# Patient Record
Sex: Male | Born: 1990
Health system: Southern US, Community
[De-identification: ages and names within clinical notes are randomized; demographics above are authoritative.]

## PROBLEM LIST (undated history)

## (undated) ENCOUNTER — Emergency Department (HOSPITAL_COMMUNITY): Admission: EM | Payer: Self-pay

## (undated) DIAGNOSIS — F329 Major depressive disorder, single episode, unspecified: Secondary | ICD-10-CM

## (undated) DIAGNOSIS — T7840XA Allergy, unspecified, initial encounter: Secondary | ICD-10-CM

## (undated) DIAGNOSIS — F419 Anxiety disorder, unspecified: Secondary | ICD-10-CM

## (undated) DIAGNOSIS — G919 Hydrocephalus, unspecified: Secondary | ICD-10-CM

## (undated) DIAGNOSIS — F32A Depression, unspecified: Secondary | ICD-10-CM

## (undated) DIAGNOSIS — Z982 Presence of cerebrospinal fluid drainage device: Secondary | ICD-10-CM

## (undated) DIAGNOSIS — F988 Other specified behavioral and emotional disorders with onset usually occurring in childhood and adolescence: Secondary | ICD-10-CM

## (undated) HISTORY — DX: Major depressive disorder, single episode, unspecified: F32.9

## (undated) HISTORY — DX: Presence of cerebrospinal fluid drainage device: Z98.2

## (undated) HISTORY — DX: Hydrocephalus, unspecified: G91.9

## (undated) HISTORY — DX: Allergy, unspecified, initial encounter: T78.40XA

## (undated) HISTORY — DX: Other specified behavioral and emotional disorders with onset usually occurring in childhood and adolescence: F98.8

## (undated) HISTORY — DX: Depression, unspecified: F32.A

## (undated) HISTORY — DX: Anxiety disorder, unspecified: F41.9

---
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
## 2008-10-12 ENCOUNTER — Ambulatory Visit (HOSPITAL_COMMUNITY): Payer: Self-pay | Admitting: Psychiatry

## 2010-01-23 ENCOUNTER — Emergency Department (HOSPITAL_COMMUNITY): Admission: EM | Admit: 2010-01-23 | Discharge: 2010-01-23 | Payer: Self-pay | Admitting: Emergency Medicine

## 2010-07-17 LAB — URINALYSIS, ROUTINE W REFLEX MICROSCOPIC
Glucose, UA: NEGATIVE mg/dL
Ketones, ur: >80 mg/dL — AB
Urobilinogen, UA: 0.2 mg/dL (ref 0.0–1.0)
pH: 5.5 (ref 5.0–8.0)

## 2010-07-17 LAB — URINE CULTURE
Colony Count: NO GROWTH
Culture  Setup Time: 201109221934
Culture: NO GROWTH

## 2010-07-17 LAB — POCT PREGNANCY, URINE: Preg Test, Ur: NEGATIVE

## 2010-07-17 LAB — URINE MICROSCOPIC-ADD ON

## 2011-05-05 HISTORY — PX: MASTECTOMY: SHX3

## 2011-10-03 HISTORY — PX: OVARIAN CYST REMOVAL: SHX89

## 2012-06-18 ENCOUNTER — Ambulatory Visit: Payer: BC Managed Care – PPO

## 2012-06-18 ENCOUNTER — Ambulatory Visit (INDEPENDENT_AMBULATORY_CARE_PROVIDER_SITE_OTHER): Payer: BC Managed Care – PPO | Admitting: Internal Medicine

## 2012-06-18 VITALS — BP 128/77 | HR 67 | Temp 99.0°F | Resp 18 | Wt 126.0 lb

## 2012-06-18 DIAGNOSIS — M25579 Pain in unspecified ankle and joints of unspecified foot: Secondary | ICD-10-CM

## 2012-06-18 NOTE — Progress Notes (Signed)
  Subjective:    Patient ID: Aaron Rangel, male    DOB: 30-Jul-1990, 22 y.o.   MRN: [id number]  HPI Pain foot  For 3 weeks as started running class/no abrupt injury/hurts to walk [facility].  Review of Systems Transgender    Objective:   Physical Exam Very tender along the first metatarsal tendon dorsally with mild crepitus Also very tender along the second metatarsal midshaft with deep palpation Pain with plantar flexion of the toe felt dorsally mtp 1/2 intact   [facility] reading (PRIMARY) by  Dr.Dema Timmons=no fx 2nd MT      Assessment & Plan:  Problem #1 tendinitis dorsal flexor tendon #1   mobic 15 #30 written ice15 minutes twice a day Refrain from running/nonweightbearing activities next 2 weeks then advance slowly  follow up 1 month if not well

## 2012-09-01 HISTORY — PX: OTHER SURGICAL HISTORY: SHX169

## 2012-09-30 DIAGNOSIS — F332 Major depressive disorder, recurrent severe without psychotic features: Secondary | ICD-10-CM | POA: Diagnosis present

## 2012-12-14 ENCOUNTER — Emergency Department (HOSPITAL_COMMUNITY)
Admission: EM | Admit: 2012-12-14 | Discharge: 2012-12-15 | Disposition: A | Discharge status: Home / Self Care | Payer: BC Managed Care – PPO | Attending: Emergency Medicine | Admitting: Emergency Medicine

## 2012-12-14 ENCOUNTER — Encounter (HOSPITAL_COMMUNITY): Payer: Self-pay | Admitting: Emergency Medicine

## 2012-12-14 DIAGNOSIS — F411 Generalized anxiety disorder: Secondary | ICD-10-CM | POA: Insufficient documentation

## 2012-12-14 DIAGNOSIS — R109 Unspecified abdominal pain: Secondary | ICD-10-CM | POA: Insufficient documentation

## 2012-12-14 DIAGNOSIS — Z79899 Other long term (current) drug therapy: Secondary | ICD-10-CM | POA: Insufficient documentation

## 2012-12-14 DIAGNOSIS — F329 Major depressive disorder, single episode, unspecified: Secondary | ICD-10-CM | POA: Insufficient documentation

## 2012-12-14 DIAGNOSIS — F3289 Other specified depressive episodes: Secondary | ICD-10-CM | POA: Insufficient documentation

## 2012-12-14 DIAGNOSIS — R110 Nausea: Secondary | ICD-10-CM | POA: Insufficient documentation

## 2012-12-14 DIAGNOSIS — Z87891 Personal history of nicotine dependence: Secondary | ICD-10-CM | POA: Insufficient documentation

## 2012-12-14 DIAGNOSIS — Z9889 Other specified postprocedural states: Secondary | ICD-10-CM | POA: Insufficient documentation

## 2012-12-14 LAB — COMPREHENSIVE METABOLIC PANEL
Albumin: 4.2 g/dL (ref 3.5–5.2)
BUN: 16 mg/dL (ref 6–23)
Chloride: 102 mEq/L (ref 96–112)
GFR calc non Af Amer: >90 mL/min (ref >90)
Glucose, Bld: 106 mg/dL — ABNORMAL HIGH (ref 70–99)
Sodium: 136 mEq/L (ref 135–145)

## 2012-12-14 LAB — CBC WITH DIFFERENTIAL/PLATELET
Basophils Absolute: 0 10*3/uL (ref 0.0–0.1)
Basophils Relative: 0 % (ref 0–1)
Eosinophils Absolute: 0.1 10*3/uL (ref 0.0–0.7)
Eosinophils Relative: 1 % (ref 0–5)
HCT: 40.7 % (ref 39.0–52.0)
Lymphs Abs: 4 10*3/uL (ref 0.7–4.0)
MCH: 26.6 pg (ref 26.0–34.0)
MCHC: 32.7 g/dL (ref 30.0–36.0)
MCV: 81.4 fL (ref 78.0–100.0)
RBC: 5 MIL/uL (ref 4.22–5.81)
WBC: 9.8 10*3/uL (ref 4.0–10.5)

## 2012-12-14 LAB — LIPASE, BLOOD: Lipase: 31 U/L (ref 11–59)

## 2012-12-14 MED ORDER — HYDROMORPHONE HCL PF 1 MG/ML IJ SOLN
1.0000 mg | Freq: Once | INTRAMUSCULAR | Status: AC
  Administered 2012-12-15: 1 mg via INTRAVENOUS
  Filled 2012-12-14: qty 1

## 2012-12-14 MED ORDER — DIPHENHYDRAMINE HCL 50 MG/ML IJ SOLN
25.0000 mg | Freq: Once | INTRAMUSCULAR | Status: AC
  Administered 2012-12-15: 25 mg via INTRAVENOUS
  Filled 2012-12-14: qty 1

## 2012-12-14 MED ORDER — ONDANSETRON HCL 4 MG/2ML IJ SOLN
4.0000 mg | Freq: Once | INTRAMUSCULAR | Status: AC
  Administered 2012-12-15: 4 mg via INTRAVENOUS
  Filled 2012-12-14: qty 2

## 2012-12-14 NOTE — ED Provider Notes (Signed)
CSN: [id number]     Arrival date & time 12/14/12  2254 History     First MD Initiated Contact with Patient 12/14/12 2324     Chief Complaint  Patient presents with  . Abdominal Pain   (Consider location/radiation/quality/duration/timing/severity/associated sxs/prior Treatment) HPI Comments: Patient with history of upper gender reassignment surgery (male sex, male gender), h/o R ovarian cyst s/p laproscopy, no other abdominal surgery -- presents with RLQ abd pain similar to previous ovarian cyst pain x 2 days. Radiation into back and leg. No fever. Nausea, no vomiting. No diarrhea, blood in stool. LMP 1 year ago. No urinary sx.  The onset of this condition was acute. The course is constant. Aggravating factors: none. Alleviating factors: none.    The history is provided by the patient.    Past Medical History  Diagnosis Date  . Depression   . Anxiety    Past Surgical History  Procedure Laterality Date  . Brain surgery    . Cardiac surgery    . Ovarian cyst removal    . Mastectomy      double  . Breast surgery     Family History  Problem Relation Age of Onset  . Cancer Maternal Grandmother   . Cancer Maternal Grandfather    History  Substance Use Topics  . Smoking status: Former Games developer  . Smokeless tobacco: Not on file  . Alcohol Use: No     Comment: weekly    Review of Systems  Constitutional: Negative for fever.  HENT: Negative for sore throat and rhinorrhea.   Eyes: Negative for redness.  Respiratory: Negative for cough.   Cardiovascular: Negative for chest pain.  Gastrointestinal: Positive for nausea. Negative for vomiting, abdominal pain and diarrhea.  Genitourinary: Negative for dysuria and hematuria.       + R pelvic pain  Musculoskeletal: Negative for myalgias.  Skin: Negative for rash.  Neurological: Negative for headaches.    Allergies  Morphine and related and Sulfa antibiotics  Home Medications   Current Outpatient Rx  Name  Route  Sig   Dispense  Refill  . citalopram (CELEXA) 20 MG tablet   Oral   Take 20 mg by mouth at bedtime.         . methylphenidate (CONCERTA) 36 MG CR tablet   Oral   Take 72 mg by mouth daily.         Marland Kitchen testosterone cypionate (DEPOTESTOTERONE CYPIONATE) 100 MG/ML injection   Intramuscular   Inject into the muscle every 14 (fourteen) days. For IM use only          BP 135/83  Pulse 73  Temp(Src) 98.2 F (36.8 C) (Oral)  Resp 18  Ht 5\' 6"  (1.676 m)  Wt 124 lb (56.246 kg)  BMI 20.02 kg/m2  SpO2 99%  Physical Exam  Nursing note and vitals reviewed. Constitutional: He appears well-developed and well-nourished.  HENT:  Head: Normocephalic and atraumatic.  Eyes: Conjunctivae are normal. Right eye exhibits no discharge. Left eye exhibits no discharge.  Neck: Normal range of motion. Neck supple.  Cardiovascular: Normal rate, regular rhythm and normal heart sounds.   Pulmonary/Chest: Effort normal and breath sounds normal.  Abdominal: Soft. There is tenderness in the right lower quadrant, suprapubic area and left lower quadrant. There is no rebound and no guarding.  Genitourinary:  Pelvic exam: normal external male genitalia, no vaginal bleeding or discharge, no CMT, R adnexal tenderness no masses  Neurological: He is alert.  Skin: Skin is warm and  dry.  Psychiatric: He has a normal mood and affect.    ED Course   Procedures (including critical care time)  Labs Reviewed  WET PREP, GENITAL - Abnormal; Notable for the following:    WBC, Wet Prep HPF POC RARE (*)    All other components within normal limits  COMPREHENSIVE METABOLIC PANEL - Abnormal; Notable for the following:    Glucose, Bld 106 (*)    All other components within normal limits  URINALYSIS, ROUTINE W REFLEX MICROSCOPIC - Abnormal; Notable for the following:    Leukocytes, UA SMALL (*)    All other components within normal limits  URINE MICROSCOPIC-ADD ON - Abnormal; Notable for the following:    Bacteria, UA  FEW (*)    All other components within normal limits  GC/CHLAMYDIA PROBE AMP  CBC WITH DIFFERENTIAL  LIPASE, BLOOD   US Pelvis Complete  12/15/2012   *RADIOLOGY REPORT*  Clinical Data:  Right-sided pelvic pain.  Ongoing gender re- assignment treatment. The patient declined transvaginal imaging.  TRANSABDOMINAL ULTRASOUND OF PELVIS  DOPPLER ULTRASOUND OF OVARIES  Technique:  Transabdominal ultrasound examination of the pelvis was performed including evaluation of the uterus, ovaries, adnexal regions, and pelvic cul-de-sac.  Color and duplex Doppler ultrasound was utilized to evaluate blood flow to the ovaries.  Comparison:  None  Findings:  Uterus: 5.8 x 3.2 x 2.6 cm.  Normal transabdominal appearance.  Endometrium: Not well visualized transabdominally.  Right ovary: 3.6 x 2.5 x 1.7 cm.  Normal.  Left ovary: 2.3 x 2.1 x 1.8 cm.  Normal.  Other Findings:  No free fluid  Pulsed Doppler evaluation demonstrates normal low-resistance arterial and venous waveforms in both ovaries.  IMPRESSION: Normal exam.  No evidence of pelvis mass or other significant abnormality.  No sonographic evidence for ovarian torsion   Original Report Authenticated By: Christiana Pellant, M.D.   Ct Abdomen Pelvis W Contrast  12/15/2012   *RADIOLOGY REPORT*  Clinical Data: Right lower quadrant pain.  Fever.  CT ABDOMEN AND PELVIS WITH CONTRAST  Technique:  Multidetector CT imaging of the abdomen and pelvis was performed following the standard protocol during bolus administration of intravenous contrast.  Contrast: OMNIPAQUE IOHEXOL 300 MG/ML  SOLN  Comparison: CT of the abdomen and pelvis 01/23/2010.  Findings:  Lung Bases: Unremarkable.  Abdomen/Pelvis:  Low attenuation adjacent to the falciform ligament within segment four of the liver is compatible with a benign perfusion anomaly or focal fat.  No other focal hepatic lesions are noted.  The appearance of the gallbladder, pancreas, spleen, bilateral adrenal glands and bilateral  kidneys is unremarkable.  No significant volume of ascites.  No pneumoperitoneum.  No pathologic distension of small bowel.  Normal appendix.  No definite pathologic lymphadenopathy identified within the abdomen or pelvis. Uterus and ovaries are unremarkable in appearance. Ventriculoperitoneal shunt tubing terminates in the left side of the pelvis.  Musculoskeletal: There are no aggressive appearing lytic or blastic lesions noted in the visualized portions of the skeleton.  IMPRESSION: 1.  No acute findings in the abdomen pelvis to account the patient's symptoms. 2.  Specifically, the appendix is normal. 3.  Additional incidental findings, as above.   Original Report Authenticated By: Trudie Reed, M.D.   [place] Art/ven Flow Abd Pelv Doppler  12/15/2012   *RADIOLOGY REPORT*  Clinical Data:  Right-sided pelvic pain.  Ongoing gender re- assignment treatment. The patient declined transvaginal imaging.  TRANSABDOMINAL ULTRASOUND OF PELVIS  DOPPLER ULTRASOUND OF OVARIES  Technique:  Transabdominal ultrasound examination  of the pelvis was performed including evaluation of the uterus, ovaries, adnexal regions, and pelvic cul-de-sac.  Color and duplex Doppler ultrasound was utilized to evaluate blood flow to the ovaries.  Comparison:  None  Findings:  Uterus: 5.8 x 3.2 x 2.6 cm.  Normal transabdominal appearance.  Endometrium: Not well visualized transabdominally.  Right ovary: 3.6 x 2.5 x 1.7 cm.  Normal.  Left ovary: 2.3 x 2.1 x 1.8 cm.  Normal.  Other Findings:  No free fluid  Pulsed Doppler evaluation demonstrates normal low-resistance arterial and venous waveforms in both ovaries.  IMPRESSION: Normal exam.  No evidence of pelvis mass or other significant abnormality.  No sonographic evidence for ovarian torsion   Original Report Authenticated By: Christiana Pellant, M.D.   1. Abdominal pain     12:01 AM Patient seen and examined. Work-up initiated. Medications ordered.   Vital signs reviewed and are as  follows: Filed Vitals:   12/14/12 2305  BP: 135/83  Pulse: 73  Temp: 98.2 F (36.8 C)  Resp: 18   12:40 AM Pelvic performed by Dorthula Nettles PA-S under my direct supervision, nurse chaperone present. Pt offered pelvic US and accepts.   Handoff to Dr. Dierdre Highman who will see. Will likely order CT.     MDM  RLQ abd pain. [place] neg for cyst.   Renne Crigler, PA-C 12/15/12 1829

## 2012-12-14 NOTE — ED Notes (Signed)
Pt c/o RLQ pain onset 2 days ago, intermittent fever. Worse with movement, walking.

## 2012-12-15 ENCOUNTER — Emergency Department (HOSPITAL_COMMUNITY): Payer: BC Managed Care – PPO

## 2012-12-15 ENCOUNTER — Encounter (HOSPITAL_COMMUNITY): Payer: Self-pay | Admitting: Radiology

## 2012-12-15 LAB — URINALYSIS, ROUTINE W REFLEX MICROSCOPIC
Glucose, UA: NEGATIVE mg/dL
Protein, ur: NEGATIVE mg/dL
Urobilinogen, UA: 0.2 mg/dL (ref 0.0–1.0)
pH: 6.5 (ref 5.0–8.0)

## 2012-12-15 LAB — GC/CHLAMYDIA PROBE AMP
CT Probe RNA: NEGATIVE
GC Probe RNA: NEGATIVE

## 2012-12-15 LAB — WET PREP, GENITAL: Yeast Wet Prep HPF POC: NONE SEEN

## 2012-12-15 LAB — URINE MICROSCOPIC-ADD ON

## 2012-12-15 MED ORDER — IOHEXOL 300 MG/ML  SOLN
100.0000 mL | Freq: Once | INTRAMUSCULAR | Status: AC | PRN
  Administered 2012-12-15: 100 mL via INTRAVENOUS

## 2012-12-15 MED ORDER — TRAMADOL HCL 50 MG PO TABS: 50.0000 mg | ORAL_TABLET | Freq: Four times a day (QID) | ORAL | Status: DC | PRN

## 2012-12-15 MED ORDER — HYDROMORPHONE HCL PF 1 MG/ML IJ SOLN
1.0000 mg | Freq: Once | INTRAMUSCULAR | Status: AC
  Administered 2012-12-15: 1 mg via INTRAVENOUS
  Filled 2012-12-15: qty 1

## 2012-12-15 MED ORDER — IOHEXOL 300 MG/ML  SOLN
50.0000 mL | Freq: Once | INTRAMUSCULAR | Status: AC | PRN
  Administered 2012-12-15: 50 mL via ORAL

## 2012-12-15 MED ORDER — DIPHENHYDRAMINE HCL 50 MG/ML IJ SOLN
25.0000 mg | Freq: Once | INTRAMUSCULAR | Status: AC
  Administered 2012-12-15: 25 mg via INTRAVENOUS

## 2012-12-15 NOTE — ED Notes (Signed)
Patient is alert and oriented x3.  He was given DC instructions and follow up visit instructions.  Patient gave verbal understanding.  He was DC ambulatory under his own power to home.  V/S stable.  He was not showing any signs of distress on DC 

## 2012-12-15 NOTE — ED Provider Notes (Signed)
Medical screening examination/treatment/procedure(s) were conducted as a shared visit with non-physician practitioner(s) and myself.  I personally evaluated the patient during the encounter  RLQ pain and TTP, CT scan without abnormalities, pain free on recheck. Rx and referral provided with d/c and f/u instructions  Sunnie Nielsen, MD 12/15/12 1907

## 2013-06-02 ENCOUNTER — Ambulatory Visit (INDEPENDENT_AMBULATORY_CARE_PROVIDER_SITE_OTHER): Payer: BC Managed Care – PPO | Admitting: Physician Assistant

## 2013-06-02 VITALS — BP 120/76 | HR 90 | Temp 98.3°F | Resp 18 | Ht 65.5 in | Wt 125.0 lb

## 2013-06-02 DIAGNOSIS — R05 Cough: Secondary | ICD-10-CM

## 2013-06-02 DIAGNOSIS — J020 Streptococcal pharyngitis: Secondary | ICD-10-CM

## 2013-06-02 DIAGNOSIS — J029 Acute pharyngitis, unspecified: Secondary | ICD-10-CM

## 2013-06-02 DIAGNOSIS — R059 Cough, unspecified: Secondary | ICD-10-CM

## 2013-06-02 LAB — POCT INFLUENZA A/B
INFLUENZA A, POC: NEGATIVE
Influenza B, POC: NEGATIVE

## 2013-06-02 LAB — POCT CBC
Granulocyte percent: 70.6 %G (ref 37–80)
HCT, POC: 48.8 % (ref 43.5–53.7)
Hemoglobin: 15.3 g/dL (ref 14.1–18.1)
Lymph, poc: 2.2 (ref 0.6–3.4)
MCH: 29.1 pg (ref 27–31.2)
MCHC: 31.4 g/dL — AB (ref 31.8–35.4)
MCV: 92.7 fL (ref 80–97)
MID (CBC): 0.7 (ref 0–0.9)
MPV: 8.5 fL (ref 0–99.8)
PLATELET COUNT, POC: 223 10*3/uL (ref 142–424)
POC GRANULOCYTE: 6.8 (ref 2–6.9)
POC LYMPH PERCENT: 22.6 %L (ref 10–50)
POC MID %: 6.8 % (ref 0–12)
RBC: 5.26 M/uL (ref 4.69–6.13)
RDW, POC: 13.7 %
WBC: 9.7 10*3/uL (ref 4.6–10.2)

## 2013-06-02 LAB — POCT RAPID STREP A (OFFICE): RAPID STREP A SCREEN: NEGATIVE

## 2013-06-02 MED ORDER — HYDROCODONE-HOMATROPINE 5-1.5 MG/5ML PO SYRP: ORAL_SOLUTION | ORAL | Status: DC

## 2013-06-02 MED ORDER — AMOXICILLIN-POT CLAVULANATE 875-125 MG PO TABS: 1.0000 | ORAL_TABLET | Freq: Two times a day (BID) | ORAL | Status: DC

## 2013-06-02 MED ORDER — IPRATROPIUM BROMIDE 0.06 % NA SOLN: 2.0000 | Freq: Three times a day (TID) | NASAL | Status: DC

## 2013-06-02 NOTE — Progress Notes (Signed)
Subjective:    Patient ID: Aaron Rangel, male    DOB: October 13, 1990, 23 y.o.   MRN: [id number]  HPI Primary Physician: No primary provider on file.  Chief Complaint: URI x 6 days  HPI: 23 y.o. male with history below presents with 6 day history of nasal congestion, sore throat, cough, post nasal drip, rhinorrhea, sinus pressure, sneezing, fever, chills, myalgias, and fatigue. Subjective fever and chills, but when he checks his temperature he is afebrile. Cough has been occasionally productive of yellow sputum. Some SOB without wheezing. Nasal congestion waxes and wanes. Headache is located along the frontal region. His fiance is sick with similar illness. She feels a little better than him currently and went to work today.  He did get an influenza vaccine this year. Has tried ibuprofen, Sudafed, Nyquil, Dayquil, and Excedrin for the migraines.   Past Medical History  Diagnosis Date  . Depression   . Anxiety      Home Meds: Prior to Admission medications   Medication Sig Start Date End Date Taking? Authorizing Provider  buPROPion (WELLBUTRIN XL) 300 MG 24 hr tablet Take 300 mg by mouth daily.   Yes Historical Provider, MD  testosterone cypionate (DEPOTESTOTERONE CYPIONATE) 100 MG/ML injection Inject into the muscle every 14 (fourteen) days. For IM use only   Yes Historical Provider, MD  traMADol (ULTRAM) 50 MG tablet Take 1 tablet (50 mg total) by mouth every 6 (six) hours as needed for pain. 12/15/12  Yes Sunnie Nielsen, MD    Allergies:  Allergies  Allergen Reactions  . Morphine And Related   . Sulfa Antibiotics     History   Social History  . Marital Status: Single    Spouse Name: N/A    Number of Children: N/A  . Years of Education: N/A   Occupational History  . Not on file.   Social History Main Topics  . Smoking status: Former Games developer  . Smokeless tobacco: Not on file  . Alcohol Use: No     Comment: weekly  . Drug Use: No  . Sexual Activity: Yes   Other Topics  Concern  . Not on file   Social History Narrative  . No narrative on file      Review of Systems  Constitutional: Positive for fever, chills, appetite change and fatigue.       Afebrile, but hot and cold.   HENT: Positive for congestion, ear pain, hearing loss, postnasal drip, rhinorrhea, sinus pressure, sneezing and sore throat.   Respiratory: Positive for cough, chest tightness and shortness of breath. Negative for wheezing.        Cough is sometimes productive of yellow sputum.  Cough is worse in the morning or if he talks a lot during the day then it is bad during the day.   Gastrointestinal: Positive for nausea and diarrhea. Negative for vomiting.       Diarrhea x 1 day.   Neurological: Positive for headaches.       Headache is located along the frontal region.        Objective:   Physical Exam  Physical Exam: Blood pressure 120/76, pulse 90, temperature 98.3 F (36.8 C), temperature source Oral, resp. rate 18, height 5' 5.5" (1.664 m), weight 125 lb (56.7 kg), SpO2 97.00%., Body mass index is 20.48 kg/(m^2). General: Well developed, well nourished, in no acute distress. Head: Normocephalic, atraumatic, eyes without discharge, sclera non-icteric, nares are congested. Bilateral auditory canals clear, TM's are without perforation, pearly grey  with reflective cone of light bilaterally. No sinus TTP. Oral cavity moist, dentition normal. Posterior pharynx erythematous with exudate along the hand side.No post nasal drip or peritonsillar abscess. Uvula midline.  Neck: Supple. No thyromegaly. Full ROM. Lymph nodes: less than 2 cm AC bilaterally. Lungs: Clear to auscultation bilaterally without wheezes, rales, or rhonchi. Breathing is unlabored.  Heart: RRR with S1 S2. No murmurs, rubs, or gallops appreciated. Msk:  Strength and tone normal for age. Extremities: No clubbing or cyanosis. No edema. Neuro: Alert and oriented X 3. Moves all extremities spontaneously. CNII-XII grossly in  tact. Psych:  Responds to questions appropriately with a normal affect.   Labs: Results for orders placed in visit on 06/02/13  POCT CBC      Result Value Range   WBC 9.7  4.6 - 10.2 K/uL   Lymph, poc 2.2  0.6 - 3.4   POC LYMPH PERCENT 22.6  10 - 50 %L   MID (cbc) 0.7  0 - 0.9   POC MID % 6.8  0 - 12 %M   POC Granulocyte 6.8  2 - 6.9   Granulocyte percent 70.6  37 - 80 %G   RBC 5.26  4.69 - 6.13 M/uL   Hemoglobin 15.3  14.1 - 18.1 g/dL   HCT, POC 16.148.8  09.643.5 - 53.7 %   MCV 92.7  80 - 97 fL   MCH, POC 29.1  27 - 31.2 pg   MCHC 31.4 (*) 31.8 - 35.4 g/dL   RDW, POC 04.513.7     Platelet Count, POC 223  142 - 424 K/uL   MPV 8.5  0 - 99.8 fL  POCT INFLUENZA A/B      Result Value Range   Influenza A, POC Negative     Influenza B, POC Negative    POCT RAPID STREP A (OFFICE)      Result Value Range   Rapid Strep A Screen Negative  Negative    Throat culture pending. Lab instructed me of a possible faint line on the RST.      Assessment & Plan:  23 year old male with likely strep pharyngitis and cough  -Augmentin 875/125 mg 1 po bid #20 no RF -Hycodan #4oz 1 tsp po q 4-6 hours prn cough no RF SED, reviewed with patient that he can tolerate this -Atrovent NS 0.06% 2 sprays each nare bid prn #1 no RF -New tooth brush -Rest/fluids -RTC precautions   Eula Listenyan Arlyn Buerkle, MHS, PA-C [facility] [address] [place], [place] [address] [phone] [facility] 06/02/2013 10:40 AM

## 2013-06-04 LAB — CULTURE, GROUP A STREP: Organism ID, Bacteria: NORMAL

## 2013-10-28 ENCOUNTER — Ambulatory Visit (INDEPENDENT_AMBULATORY_CARE_PROVIDER_SITE_OTHER): Payer: BC Managed Care – PPO | Admitting: Family Medicine

## 2013-10-28 VITALS — BP 120/90 | HR 80 | Temp 98.3°F | Resp 16 | Ht 65.0 in | Wt 127.0 lb

## 2013-10-28 DIAGNOSIS — J029 Acute pharyngitis, unspecified: Secondary | ICD-10-CM

## 2013-10-28 DIAGNOSIS — J028 Acute pharyngitis due to other specified organisms: Secondary | ICD-10-CM

## 2013-10-28 LAB — POCT RAPID STREP A (OFFICE): Rapid Strep A Screen: NEGATIVE

## 2013-10-28 MED ORDER — AZITHROMYCIN 250 MG PO TABS: ORAL_TABLET | ORAL | Status: DC

## 2013-10-28 MED ORDER — AMOXICILLIN 875 MG PO TABS: 875.0000 mg | ORAL_TABLET | Freq: Two times a day (BID) | ORAL | Status: DC

## 2013-10-28 NOTE — Progress Notes (Addendum)
The chart was scribed for Aaron SidleKurt Lauenstein, MD, by Aaron Rangel, ED Scribe. This patient's care was started at 12:18 PM.  Patient ID: Aaron Rangel MRN: [id number], DOB: 1991/02/06, 22 y.o. Date of Encounter: 10/28/2013, 12:17 PM  Primary Physician: No PCP Per Patient  Chief Complaint:  Chief Complaint  Patient presents with   Otalgia   Headache     HPI: 23 y.o. year old male with history below presents with right-sided otalgia, onset of three days ago, which has also been associated with a sore throat, worse on the right. He states he has also had a headache for a week. Mr. Aaron Rangel denies a fever. He has been swimming recently.   The pt works at a rehabilitation center.    Past Medical History  Diagnosis Date   Depression    Anxiety      Home Meds: Prior to Admission medications   Medication Sig Start Date End Date Taking? Authorizing Provider  buPROPion (WELLBUTRIN XL) 300 MG 24 hr tablet Take 450 mg by mouth daily.    Yes Historical Provider, MD  testosterone cypionate (DEPOTESTOTERONE CYPIONATE) 100 MG/ML injection Inject into the muscle every 14 (fourteen) days. For IM use only   Yes Historical Provider, MD    Allergies:  Allergies  Allergen Reactions   Morphine And Related    Sulfa Antibiotics     History   Social History   Marital Status: Single    Spouse Name: N/A    Number of Children: N/A   Years of Education: N/A   Occupational History   Not on file.   Social History Main Topics   Smoking status: Former Smoker   Smokeless tobacco: Not on file   Alcohol Use: No     Comment: weekly   Drug Use: No   Sexual Activity: Yes   Other Topics Concern   Not on file   Social History Narrative   No narrative on file     Review of Systems: Constitutional: negative for chills, fever, night sweats, weight changes, or fatigue  HEENT: positive for otalgia and sore throat; negative for vision changes, hearing loss, congestion, rhinorrhea,  epistaxis, or sinus pressure Cardiovascular: negative for chest pain or palpitations Respiratory: negative for hemoptysis, wheezing, shortness of breath, or cough Abdominal: negative for abdominal pain, nausea, vomiting, diarrhea, or constipation Dermatological: negative for rash Neurologic: negative for headache, dizziness, or syncope All other systems reviewed and are otherwise negative with the exception to those above and in the HPI.   Physical Exam: Triage Vitals: Blood pressure 120/90, pulse 80, temperature 98.3 F (36.8 C), temperature source Oral, resp. rate 16, height 5\' 5"  (1.651 m), weight 127 lb (57.607 kg), SpO2 98.00%., Body mass index is 21.13 kg/(m^2). General: Well developed, well nourished, in no acute distress. Head: Normocephalic, atraumatic, eyes without discharge, sclera non-icteric, nares are without discharge. Bilateral auditory canals clear, TM's are without perforation, pearly grey and translucent with reflective cone of light bilaterally. Oral cavity moist, posterior pharynx without exudate, erythema, peritonsillar abscess, or post nasal drip.  Neck: Supple. No thyromegaly. Full ROM. No lymphadenopathy. Lungs: Clear bilaterally to auscultation without wheezes, rales, or rhonchi. Breathing is unlabored. Heart: RRR with S1 S2. No murmurs, rubs, or gallops appreciated. Abdomen: Soft, non-tender, non-distended with normoactive bowel sounds. No hepatomegaly. No rebound/guarding. No obvious abdominal masses. Msk:  Strength and tone normal for age. Extremities/Skin: Warm and dry. No clubbing or cyanosis. No edema. No rashes or suspicious lesions. Neuro: Alert and oriented  X 3. Moves all extremities spontaneously. Gait is normal. CNII-XII grossly in tact. Psych:  Responds to questions appropriately with a normal affect.  Tender AC nodes Mild erythema R>L throat Labs: Results for orders placed in visit on 06/02/13  CULTURE, GROUP A STREP      Result Value Ref Range    Organism ID, Bacteria Normal Upper Respiratory Flora     Organism ID, Bacteria No Beta Hemolytic Streptococci Isolated    POCT CBC      Result Value Ref Range   WBC 9.7  4.6 - 10.2 K/uL   Lymph, poc 2.2  0.6 - 3.4   POC LYMPH PERCENT 22.6  10 - 50 %L   MID (cbc) 0.7  0 - 0.9   POC MID % 6.8  0 - 12 %M   POC Granulocyte 6.8  2 - 6.9   Granulocyte percent 70.6  37 - 80 %G   RBC 5.26  4.69 - 6.13 M/uL   Hemoglobin 15.3  14.1 - 18.1 g/dL   HCT, POC 95.648.8  21.343.5 - 53.7 %   MCV 92.7  80 - 97 fL   MCH, POC 29.1  27 - 31.2 pg   MCHC 31.4 (*) 31.8 - 35.4 g/dL   RDW, POC 08.613.7     Platelet Count, POC 223  142 - 424 K/uL   MPV 8.5  0 - 99.8 fL  POCT INFLUENZA A/B      Result Value Ref Range   Influenza A, POC Negative     Influenza B, POC Negative    POCT RAPID STREP A (OFFICE)      Result Value Ref Range   Rapid Strep A Screen Negative  Negative    Results for orders placed in visit on 10/28/13  POCT RAPID STREP A (OFFICE)      Result Value Ref Range   Rapid Strep A Screen Negative  Negative     ASSESSMENT AND PLAN:  23 y.o. year old male with Acute pharyngitis due to other specified organisms - Plan: POCT rapid strep A, Throat culture ([facility]), amoxicillin (AMOXIL) 875 MG tablet     Signed, Aaron SidleKurt Lauenstein, MD 10/28/2013 12:17 PM

## 2013-10-28 NOTE — Addendum Note (Signed)
Addended by: Cydney OkAUGUSTIN, TAMARA N on: 10/28/2013 01:52 PM   Modules accepted: Orders, Medications

## 2013-10-28 NOTE — Patient Instructions (Signed)

## 2013-10-30 LAB — CULTURE, GROUP A STREP: Organism ID, Bacteria: NORMAL

## 2013-11-09 HISTORY — PX: TOTAL LAPAROSCOPIC HYSTERECTOMY WITH SALPINGECTOMY: SHX6742

## 2014-01-21 IMAGING — CT CT ABD-PELV W/ CM
1 of 2 series · 15 of 32 positions shown, 19 images · IV contrast (omnipaque)
Comparison: CT of the abdomen and pelvis 01/23/2010.

CLINICAL DATA: Right lower quadrant pain.  Fever.

CT ABDOMEN AND PELVIS WITH CONTRAST
TECHNIQUE: Multidetector CT imaging of the abdomen and pelvis was
performed following the standard protocol during bolus
administration of intravenous contrast.
Contrast: 100mL OMNIPAQUE IOHEXOL 300 MG/ML  SOLN

[Series 2: abd/pel with · axial · 0.64mm/px · z∈[+1248,+1598]mm · 15 of 79 slices shown, 19 images]
[im 6/79  soft-tissue]
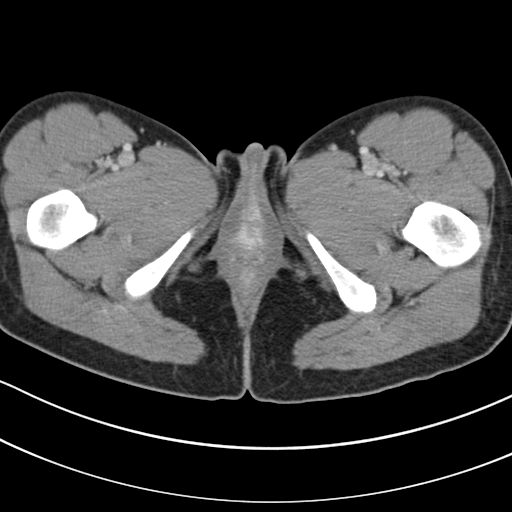
[im 6/79  bone]
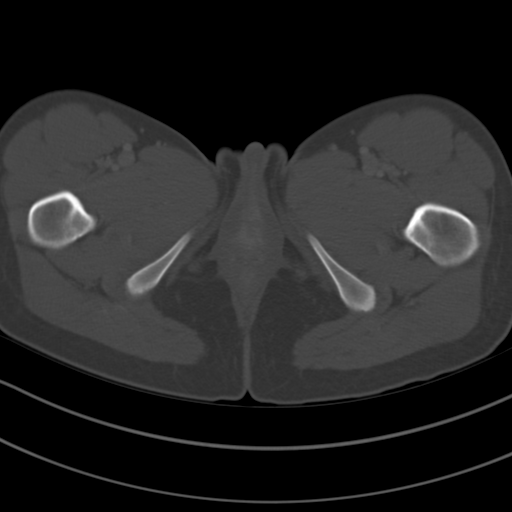
[im 12/79  soft-tissue]
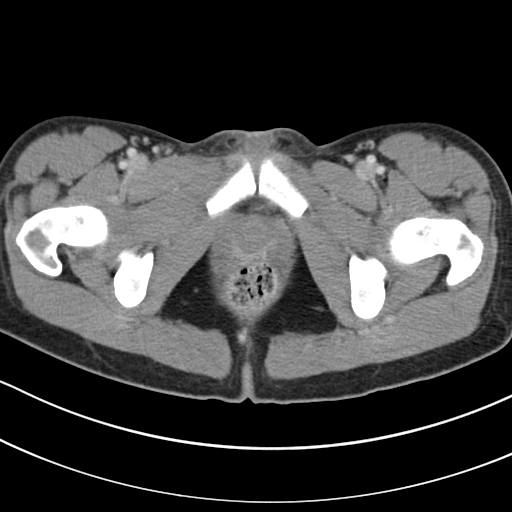
[im 18/79  soft-tissue]
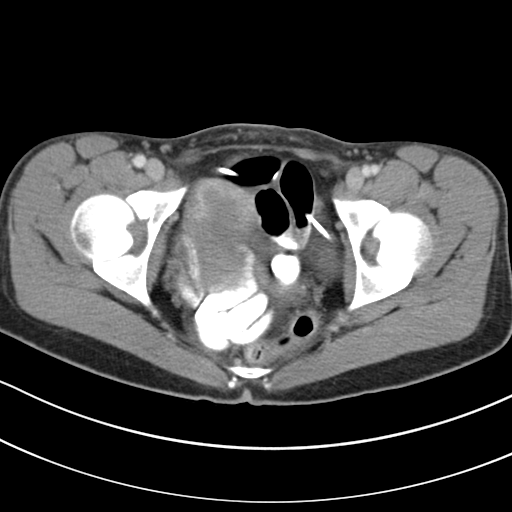
[im 24/79  soft-tissue]
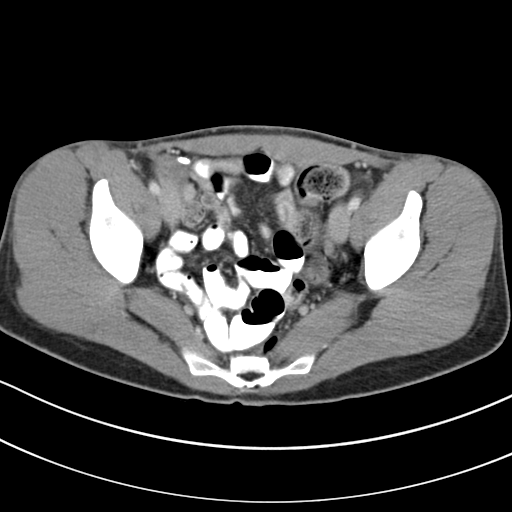
[im 29/79  soft-tissue]
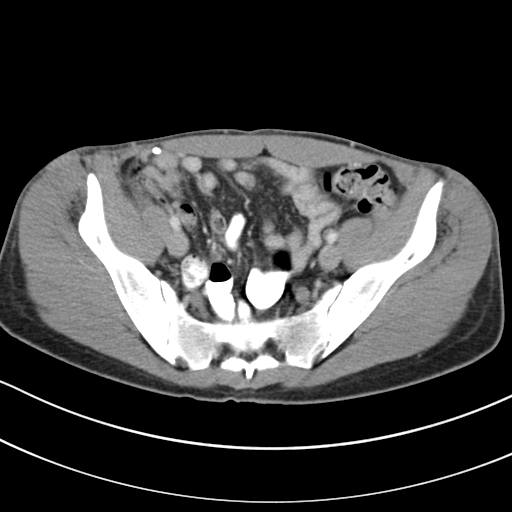
[im 35/79  soft-tissue]
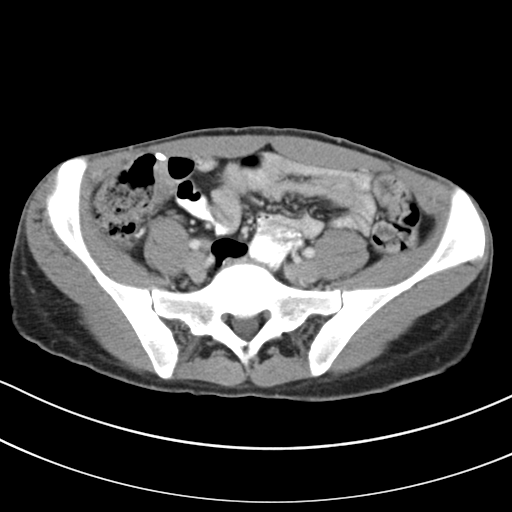
[im 41/79  soft-tissue]
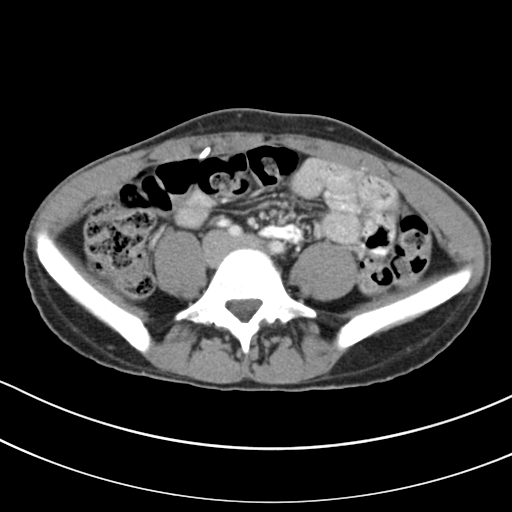
[im 47/79  soft-tissue]
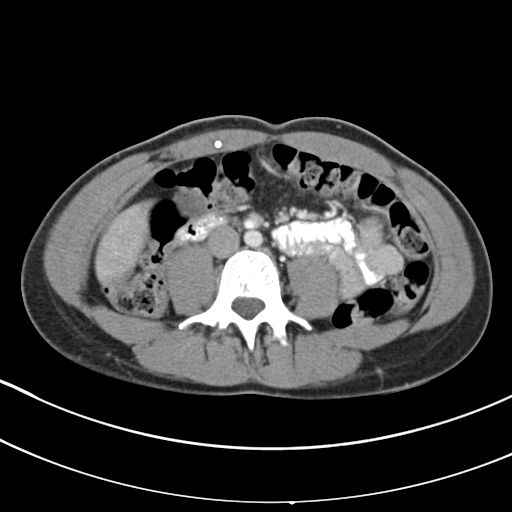
[im 53/79  soft-tissue]
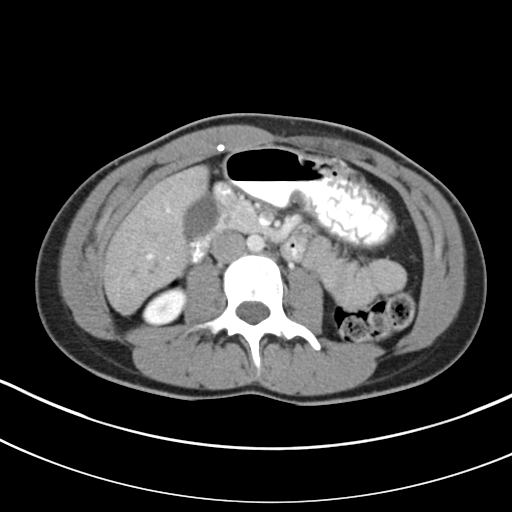
[im 53/79  bone]
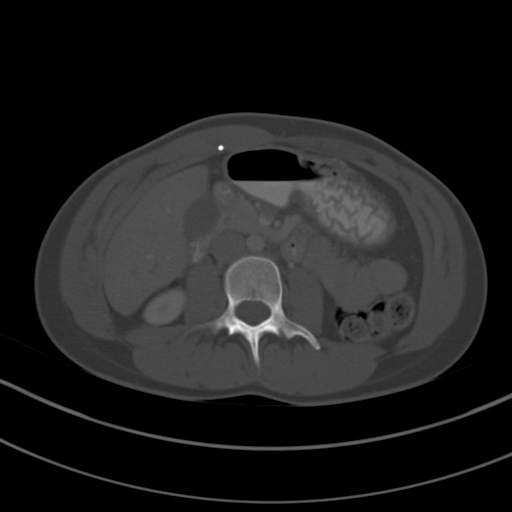
[im 58/79  soft-tissue]
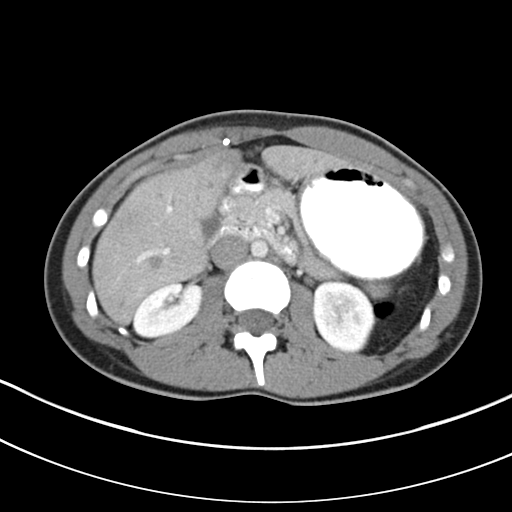
[im 64/79  soft-tissue]
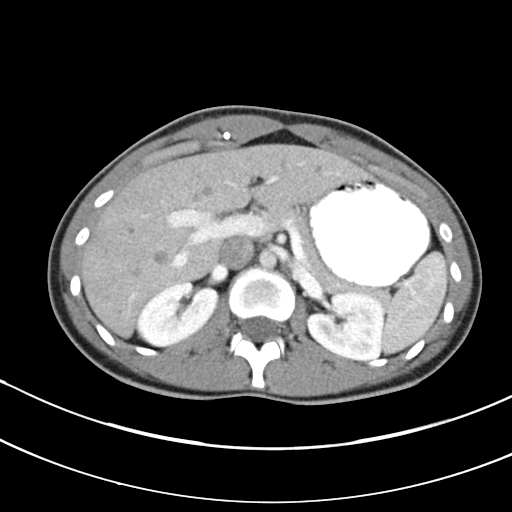
[im 67/79  lung]
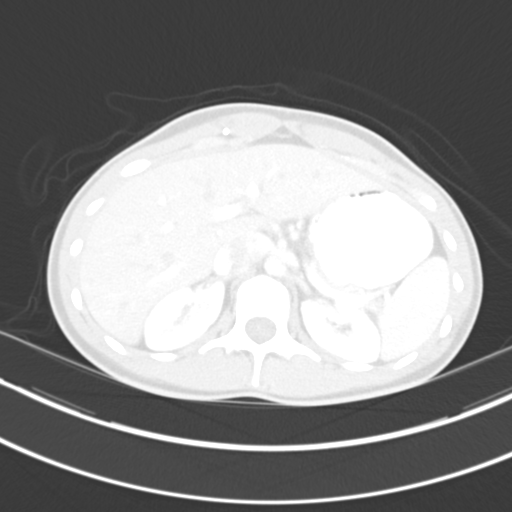
[im 70/79  soft-tissue]
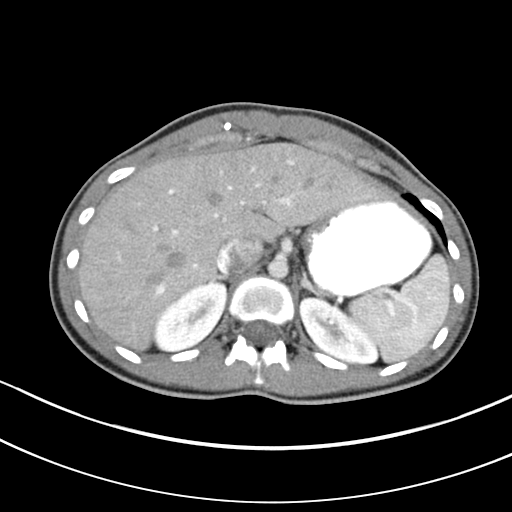
[im 70/79  lung]
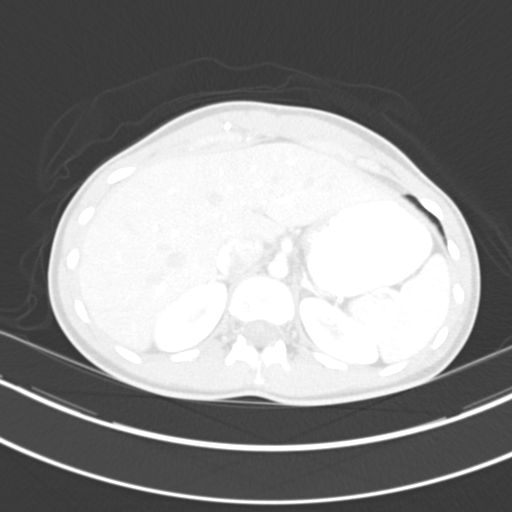
[im 73/79  lung]
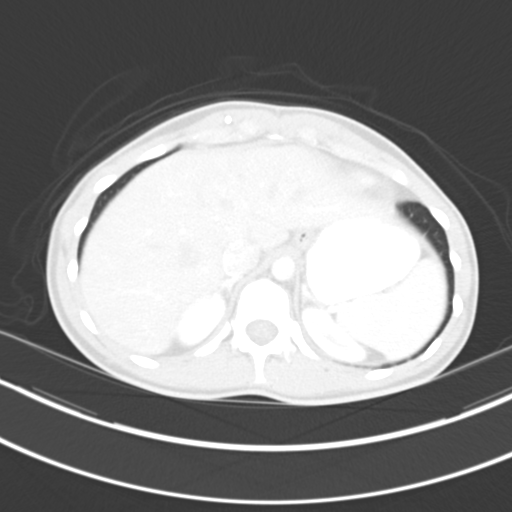
[im 76/79  soft-tissue]
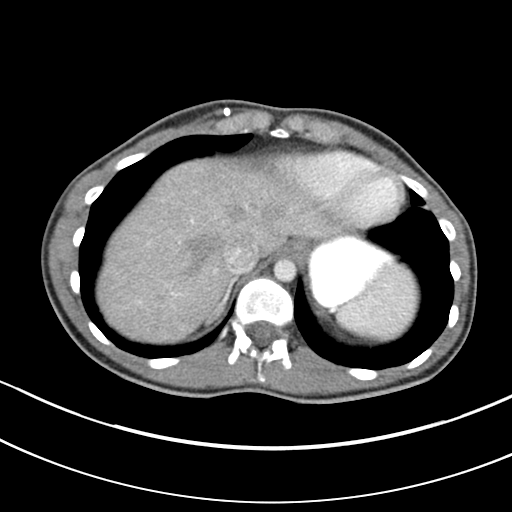
[im 76/79  lung]
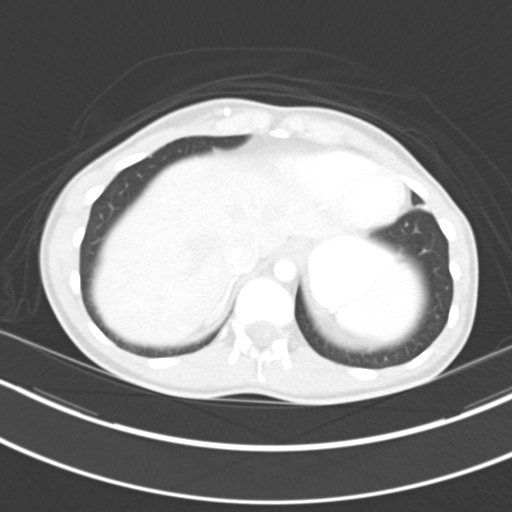

[15 of 32 positions shown; findings below may reference images not displayed]

FINDINGS: Lung Bases: Unremarkable.

Abdomen/Pelvis:  Low attenuation adjacent to the falciform ligament
within segment four of the liver is compatible with a benign
perfusion anomaly or focal fat.  No other focal hepatic lesions are
noted.  The appearance of the gallbladder, pancreas, spleen,
bilateral adrenal glands and bilateral kidneys is unremarkable.  No
significant volume of ascites.  No pneumoperitoneum.  No pathologic
distension of small bowel.  Normal appendix.  No definite
pathologic lymphadenopathy identified within the abdomen or pelvis.
Uterus and ovaries are unremarkable in appearance.
Ventriculoperitoneal shunt tubing terminates in the left side of
the pelvis.

Musculoskeletal: There are no aggressive appearing lytic or blastic
lesions noted in the visualized portions of the skeleton.
IMPRESSION: 1.  No acute findings in the abdomen pelvis to account the
patient's symptoms.
2.  Specifically, the appendix is normal.
3.  Additional incidental findings, as above.

## 2017-09-30 ENCOUNTER — Other Ambulatory Visit: Payer: Self-pay

## 2017-09-30 ENCOUNTER — Encounter (HOSPITAL_COMMUNITY): Payer: Self-pay

## 2017-09-30 ENCOUNTER — Inpatient Hospital Stay (HOSPITAL_COMMUNITY)
Admission: RE | Admit: 2017-09-30 | Discharge: 2017-10-04 | DRG: 885 | Disposition: A | Discharge status: Home / Self Care | Payer: 59 | Attending: Psychiatry | Admitting: Psychiatry

## 2017-09-30 DIAGNOSIS — F649 Gender identity disorder, unspecified: Secondary | ICD-10-CM | POA: Diagnosis present

## 2017-09-30 DIAGNOSIS — R197 Diarrhea, unspecified: Secondary | ICD-10-CM | POA: Diagnosis not present

## 2017-09-30 DIAGNOSIS — F332 Major depressive disorder, recurrent severe without psychotic features: Secondary | ICD-10-CM | POA: Diagnosis present

## 2017-09-30 DIAGNOSIS — F4001 Agoraphobia with panic disorder: Secondary | ICD-10-CM | POA: Diagnosis present

## 2017-09-30 DIAGNOSIS — R634 Abnormal weight loss: Secondary | ICD-10-CM | POA: Diagnosis present

## 2017-09-30 DIAGNOSIS — Z6379 Other stressful life events affecting family and household: Secondary | ICD-10-CM | POA: Diagnosis not present

## 2017-09-30 DIAGNOSIS — Z813 Family history of other psychoactive substance abuse and dependence: Secondary | ICD-10-CM | POA: Diagnosis not present

## 2017-09-30 DIAGNOSIS — Z569 Unspecified problems related to employment: Secondary | ICD-10-CM

## 2017-09-30 DIAGNOSIS — F640 Transsexualism: Secondary | ICD-10-CM

## 2017-09-30 DIAGNOSIS — F419 Anxiety disorder, unspecified: Secondary | ICD-10-CM | POA: Diagnosis present

## 2017-09-30 DIAGNOSIS — Z882 Allergy status to sulfonamides status: Secondary | ICD-10-CM | POA: Diagnosis not present

## 2017-09-30 DIAGNOSIS — Z811 Family history of alcohol abuse and dependence: Secondary | ICD-10-CM

## 2017-09-30 DIAGNOSIS — Z681 Body mass index (BMI) 19 or less, adult: Secondary | ICD-10-CM | POA: Diagnosis not present

## 2017-09-30 DIAGNOSIS — G4700 Insomnia, unspecified: Secondary | ICD-10-CM | POA: Diagnosis present

## 2017-09-30 DIAGNOSIS — Z87891 Personal history of nicotine dependence: Secondary | ICD-10-CM

## 2017-09-30 DIAGNOSIS — Z885 Allergy status to narcotic agent status: Secondary | ICD-10-CM

## 2017-09-30 DIAGNOSIS — R45851 Suicidal ideations: Secondary | ICD-10-CM | POA: Diagnosis present

## 2017-09-30 DIAGNOSIS — Z818 Family history of other mental and behavioral disorders: Secondary | ICD-10-CM | POA: Diagnosis not present

## 2017-09-30 LAB — COMPREHENSIVE METABOLIC PANEL
ALT: 18 U/L (ref 17–63)
ANION GAP: 9 (ref 5–15)
AST: 18 U/L (ref 15–41)
Albumin: 4.6 g/dL (ref 3.5–5.0)
Alkaline Phosphatase: 57 U/L (ref 38–126)
BUN: 15 mg/dL (ref 6–20)
CHLORIDE: 107 mmol/L (ref 101–111)
CO2: 26 mmol/L (ref 22–32)
Calcium: 9.4 mg/dL (ref 8.9–10.3)
Creatinine, Ser: 0.71 mg/dL (ref 0.61–1.24)
Glucose, Bld: 88 mg/dL (ref 65–99)
POTASSIUM: 4 mmol/L (ref 3.5–5.1)
Sodium: 142 mmol/L (ref 135–145)
TOTAL PROTEIN: 7.2 g/dL (ref 6.5–8.1)
Total Bilirubin: 1.4 mg/dL — ABNORMAL HIGH (ref 0.3–1.2)

## 2017-09-30 LAB — RAPID URINE DRUG SCREEN, HOSP PERFORMED
AMPHETAMINES: NOT DETECTED
Barbiturates: NOT DETECTED
Benzodiazepines: NOT DETECTED
Cocaine: NOT DETECTED
OPIATES: NOT DETECTED
Tetrahydrocannabinol: POSITIVE — AB

## 2017-09-30 LAB — URINALYSIS, COMPLETE (UACMP) WITH MICROSCOPIC
BACTERIA UA: NONE SEEN
BILIRUBIN URINE: NEGATIVE
Glucose, UA: NEGATIVE mg/dL
HGB URINE DIPSTICK: NEGATIVE
Ketones, ur: NEGATIVE mg/dL
LEUKOCYTES UA: NEGATIVE
NITRITE: NEGATIVE
PROTEIN: NEGATIVE mg/dL
Specific Gravity, Urine: 1.019 (ref 1.005–1.030)
pH: 6 (ref 5.0–8.0)

## 2017-09-30 LAB — HEMOGLOBIN A1C
HEMOGLOBIN A1C: 5 % (ref 4.8–5.6)
MEAN PLASMA GLUCOSE: 96.8 mg/dL

## 2017-09-30 LAB — LIPID PANEL
CHOLESTEROL: 159 mg/dL (ref 0–200)
HDL: 62 mg/dL (ref >40)
LDL Cholesterol: 80 mg/dL (ref 0–99)
TRIGLYCERIDES: 83 mg/dL (ref <150)
Total CHOL/HDL Ratio: 2.6 RATIO
VLDL: 17 mg/dL (ref 0–40)

## 2017-09-30 LAB — CBC
HCT: 40 % (ref 39.0–52.0)
Hemoglobin: 14 g/dL (ref 13.0–17.0)
MCH: 29.9 pg (ref 26.0–34.0)
MCHC: 35 g/dL (ref 30.0–36.0)
MCV: 85.5 fL (ref 78.0–100.0)
PLATELETS: 230 10*3/uL (ref 150–400)
RBC: 4.68 MIL/uL (ref 4.22–5.81)
RDW: 12 % (ref 11.5–15.5)
WBC: 8.1 10*3/uL (ref 4.0–10.5)

## 2017-09-30 LAB — ETHANOL

## 2017-09-30 LAB — TSH: TSH: 0.767 u[IU]/mL (ref 0.350–4.500)

## 2017-09-30 MED ORDER — SERTRALINE HCL 50 MG PO TABS
50.0000 mg | ORAL_TABLET | Freq: Every day | ORAL | Status: DC
  Administered 2017-09-30 – 2017-10-02 (×3): 50 mg via ORAL
  Filled 2017-09-30 (×5): qty 1

## 2017-09-30 MED ORDER — LORAZEPAM 0.5 MG PO TABS
0.5000 mg | ORAL_TABLET | Freq: Four times a day (QID) | ORAL | Status: DC | PRN
  Administered 2017-09-30 – 2017-10-01 (×2): 0.5 mg via ORAL
  Filled 2017-09-30 (×2): qty 1

## 2017-09-30 MED ORDER — MIRTAZAPINE 7.5 MG PO TABS
7.5000 mg | ORAL_TABLET | Freq: Every day | ORAL | Status: DC
  Administered 2017-09-30: 7.5 mg via ORAL
  Filled 2017-09-30 (×3): qty 1

## 2017-09-30 NOTE — Progress Notes (Signed)
Patient ID: Aaron Rangel, adult   DOB: 10-21-1990, 27 y.o.   MRN: [id number]   Report accepted from admitting nurse, Christen Bame, RN. Pt currently presents with a blunted affect and cooperative behavior. Pt supported emotionally and encouraged to express concerns and questions. Pt's safety ensured with 15 minute and environmental checks. Pt currently denies SI/HI and A/V hallucinations. Pt verbally agrees to seek staff if SI/HI or A/VH occurs and to consult with staff before acting on any harmful thoughts. UA collected. EKG completed, shown to Dr. Jama Flavors, MD. MD has no current concerns. Pt given medication for anxiety and depression per orders.Will continue POC.

## 2017-09-30 NOTE — BH Assessment (Signed)
Assessment Note  Aaron Rangel is a 27 y.o. male, in [facility] as a walk in due to SI. Pt is a FTM transgender w/ a hx of depression and anxiety. He had to stop all of his medications 10 months ago due to loss of insurance and lack of money. He's been feeling suicidal for the past 3 months, but it has worsened over the past few weeks, to the point where he has been thinking about shooting himself with a gun. Pt denies HI or AVH. He is unable to contract for safety at this time.   Diagnosis: MDD, recurrent episode, severe; GAD  Past Medical History:  Past Medical History:  Diagnosis Date  . Anxiety   . Depression     Past Surgical History:  Procedure Laterality Date  . BRAIN SURGERY    . BREAST SURGERY    . CARDIAC SURGERY    . MASTECTOMY     double  . OVARIAN CYST REMOVAL      Family History:  Family History  Problem Relation Age of Onset  . Cancer Maternal Grandmother   . Cancer Maternal Grandfather     Social History:  reports that he has quit smoking. He does not have any smokeless tobacco history on file. He reports that he does not drink alcohol or use drugs.  Additional Social History:  Alcohol / Drug Use Pain Medications: denies Prescriptions: denies Over the Counter: denies History of alcohol / drug use?: Yes Substance #1 Name of Substance 1: marijuana 1 - Frequency: nightly (to sleep) 1 - Duration: ongoing 1 - Last Use / Amount: last night  CIWA:   COWS:    Allergies:  Allergies  Allergen Reactions  . Morphine And Related   . Sulfa Antibiotics     Home Medications:  (Not in a hospital admission)  OB/GYN Status:  No LMP for male patient.  General Assessment Data Location of Assessment: [facility] Assessment Services TTS Assessment: In system Is this a Tele or Face-to-Face Assessment?: Face-to-Face Is this an Initial Assessment or a Re-assessment for this encounter?: Initial Assessment Marital status: Separated Living Arrangements: Spouse/significant  other Can pt return to current living arrangement?: Yes Admission Status: Voluntary Is patient capable of signing voluntary admission?: Yes Referral Source: Self/Family/Friend  Medical Screening Exam [facility] Walk-in ONLY) Medical Exam completed: Yes  Crisis Care Plan Living Arrangements: Spouse/significant other Name of Psychiatrist: none Name of Therapist: Nathen May, [facility]  Education Status Is patient currently in school?: No Is the patient employed, unemployed or receiving disability?: Employed  Risk to self with the past 6 months Suicidal Ideation: Yes-Currently Present Has patient been a risk to self within the past 6 months prior to admission? : No Suicidal Intent: Yes-Currently Present Has patient had any suicidal intent within the past 6 months prior to admission? : No Is patient at risk for suicide?: Yes Suicidal Plan?: Yes-Currently Present Has patient had any suicidal plan within the past 6 months prior to admission? : No Specify Current Suicidal Plan: shoot self Access to Means: Yes Specify Access to Suicidal Means: gun Previous Attempts/Gestures: No Intentional Self Injurious Behavior: Cutting Comment - Self Injurious Behavior: pt has hx of cutting Family Suicide History: Unknown Recent stressful life event(s): Other (Comment) Persecutory voices/beliefs?: No Depression: Yes Depression Symptoms: Insomnia, Tearfulness, Isolating, Fatigue, Guilt, Loss of interest in usual pleasures, Feeling worthless/self pity, Feeling angry/irritable Substance abuse history and/or treatment for substance abuse?: No Suicide prevention information given to non-admitted patients: Not applicable  Risk to Others within the past 6 months Homicidal Ideation: No Does patient have any lifetime risk of violence toward others beyond the six months prior to admission? : No Thoughts of Harm to Others: No Current Homicidal Intent: No Current Homicidal Plan: No Access to Homicidal Means:  No History of harm to others?: No Assessment of Violence: None Noted Does patient have access to weapons?: No Criminal Charges Pending?: No Does patient have a court date: No Is patient on probation?: No  Psychosis Hallucinations: None noted Delusions: None noted  Mental Status Report Appearance/Hygiene: Unremarkable Eye Contact: Good Motor Activity: Unremarkable Speech: Logical/coherent Level of Consciousness: Alert Mood: Depressed, Anxious Affect: Anxious Anxiety Level: Moderate Thought Processes: Coherent, Relevant Judgement: Partial Orientation: Person, Place, Time, Situation Obsessive Compulsive Thoughts/Behaviors: None  Cognitive Functioning Concentration: Normal Memory: Recent Intact, Remote Intact Is patient IDD: No Is patient DD?: No Insight: Fair Impulse Control: Unable to Assess Appetite: Poor Have you had any weight changes? : Loss Amount of the weight change? (lbs): 20 lbs Sleep: Decreased Total Hours of Sleep: 3 Vegetative Symptoms: None  ADLScreening [facility] Assessment Services) Patient's cognitive ability adequate to safely complete daily activities?: Yes Patient able to express need for assistance with ADLs?: Yes Independently performs ADLs?: Yes (appropriate for developmental age)  Prior Inpatient Therapy Prior Inpatient Therapy: Yes Prior Therapy Dates: 2016 Prior Therapy Facilty/Provider(s): [facility] in [place] Reason for Treatment: SI  Prior Outpatient Therapy Prior Outpatient Therapy: No Does patient have an ACCT team?: No Does patient have Intensive In-House Services?  : No Does patient have [facility] services? : No Does patient have P4CC services?: No  ADL Screening (condition at time of admission) Patient's cognitive ability adequate to safely complete daily activities?: Yes Is the patient deaf or have difficulty hearing?: No Does the patient have difficulty seeing, even when wearing glasses/contacts?: No Does the patient have  difficulty concentrating, remembering, or making decisions?: No Patient able to express need for assistance with ADLs?: Yes Does the patient have difficulty dressing or bathing?: No Independently performs ADLs?: Yes (appropriate for developmental age) Does the patient have difficulty walking or climbing stairs?: No Weakness of Legs: None Weakness of Arms/Hands: None  Home Assistive Devices/Equipment Home Assistive Devices/Equipment: None    Abuse/Neglect Assessment (Assessment to be complete while patient is alone) Abuse/Neglect Assessment Can Be Completed: Yes Physical Abuse: Denies Verbal Abuse: Denies Sexual Abuse: Denies Exploitation of patient/patient's resources: Denies Self-Neglect: Denies Values / Beliefs Cultural Requests During Hospitalization: None Spiritual Requests During Hospitalization: None   Advance Directives (For Healthcare) Does Patient Have a Medical Advance Directive?: No Would patient like information on creating a medical advance directive?: No - Patient declined Nutrition Screen- [facility] Adult/[facility]/AP Patient's home diet: Regular Has the patient recently lost weight without trying?: Yes, 14-23 lbs. Has the patient been eating poorly because of a decreased appetite?: Yes Malnutrition Screening Tool Score: 3  Additional Information 1:1 In Past 12 Months?: No CIRT Risk: No Elopement Risk: No Does patient have medical clearance?: Yes     Disposition:  Disposition Initial Assessment Completed for this Encounter: Yes Disposition of Patient: Admit  On Site Evaluation by:   Reviewed with Physician:    Laddie Aquas 09/30/2017 12:42 PM

## 2017-09-30 NOTE — Progress Notes (Signed)
Pt was observed in dayroom, seen making a phone call. Pt presents with an anxious affect and mood. Pt denies SI/HI/AVH/Pain at this time. Pt states he hopes to be discharge soon. Pt states he just wanted his medications restarted/adjusted. Pt states he will continue seeing same therapist and PCP once discharge. Will continue with POC.

## 2017-09-30 NOTE — H&P (Signed)
Behavioral Health Medical Screening Exam  Aaron Rangel is an 27 y.o. male patient presents as a walk in with complaints of suicidal ideation and plan to shoot himself.  States that he does not have guns at home but he can make a way to get one; if no gun he would hang himself.  Unable to contract for safety  Total Time spent with patient: 30 minutes  Psychiatric Specialty Exam: Physical Exam  Vitals reviewed. Constitutional: He is oriented to person, place, and time. He appears well-developed.  Neck: Normal range of motion. Neck supple.  Respiratory: Effort normal.  Musculoskeletal: Normal range of motion.  Neurological: He is alert and oriented to person, place, and time.  Skin: Skin is warm and dry.    Review of Systems  Psychiatric/Behavioral: Positive for depression and suicidal ideas. The patient is nervous/anxious.   All other systems reviewed and are negative.   Blood pressure 120/80, pulse (!) 55, temperature 98.9 F (37.2 C), resp. rate 16, SpO2 100 %.There is no height or weight on file to calculate BMI.  General Appearance: Casual and Neat  Eye Contact:  Good  Speech:  Clear and Coherent and Normal Rate  Volume:  Normal  Mood:  Depressed  Affect:  Depressed and Flat  Thought Process:  Coherent and Goal Directed  Orientation:  Full (Time, Place, and Person)  Thought Content:  Logical  Suicidal Thoughts:  Yes.  with intent/plan  Homicidal Thoughts:  No  Memory:  Immediate;   Good Recent;   Good Remote;   Good  Judgement:  Impaired  Insight:  Lacking  Psychomotor Activity:  Normal  Concentration: Concentration: Good and Attention Span: Good  Recall:  Good  Fund of Knowledge:Good  Language: Good  Akathisia:  No  Handed:  Right  AIMS (if indicated):     Assets:  Communication Skills Desire for Improvement Housing Physical Health Social Support Transportation  Sleep:       Musculoskeletal: Strength & Muscle Tone: within normal limits Gait & Station:  normal Patient leans: N/A  Blood pressure 120/80, pulse (!) 55, temperature 98.9 F (37.2 C), resp. rate 16, SpO2 100 %.  Recommendations:  Inpatient psychiatric treatment  Based on my evaluation the patient does not appear to have an emergency medical condition.  Shuvon Rankin, NP 09/30/2017, 1:17 PM

## 2017-09-30 NOTE — H&P (Signed)
Psychiatric Admission Assessment Adult  Patient Identification: Aaron Rangel MRN:  [id number] Date of Evaluation:  09/30/2017 Chief Complaint:  Worsening Depression  Principal Diagnosis: MDD, no Psychotic Features  Diagnosis:   Patient Active Problem List   Diagnosis Date Noted  . MDD (major depressive disorder), recurrent severe, without psychosis (HCC) [F33.2] 09/30/2017   History of Present Illness: Patient is 27 year old transgender male, presented to hospital voluntarily in company of GF.  Reports worsening depression over recent months, and describes recent suicidal ideations, with thoughts of hanging self or of shooting self,although states he does not have access to a gun. Endorses neuro-vegetative symptoms as below. Denies history of psychosis.  Attributes depression in part to separation/ currently going through actual divorce process, recent wedding anniversary, job related stress, and being off his psychiatric medications x several months. Associated Signs/Symptoms: Depression Symptoms:  depressed mood, anhedonia, insomnia, suicidal thoughts with specific plan, loss of energy/fatigue, weight loss, decreased appetite, reports he has lost about 20 lbs over the last 6 months (Hypo) Manic Symptoms:  None noted or endorsed . Anxiety Symptoms:   Frequent panic attacks and some agoraphobia, also describes worsening anxiety, worry recently  Psychotic Symptoms:  Denies  PTSD Symptoms: Does not endorse  Total Time spent with patient: 45 minutes  Past Psychiatric History: one prior psychiatric admission in [place] ( 2016) for depression. States he has never attempted suicide, history of self cutting , which he states has been " on and off ", more frequently when facing more stress, denies history of psychosis, denies history of mania, reports history of Panic Attacks and Agoraphobia.Denies history of violence . States he had been doing well on psychiatric medications , but stopped  several months ago, not due to side effects but because of cost/affordability .  Is the patient at risk to self? Yes.    Has the patient been a risk to self in the past 6 months? Yes.    Has the patient been a risk to self within the distant past? No.  Is the patient a risk to others? No.  Has the patient been a risk to others in the past 6 months? No.  Has the patient been a risk to others within the distant past? No.   Prior Inpatient Therapy: Prior Inpatient Therapy: Yes Prior Therapy Dates: 2016 Prior Therapy Facilty/Provider(s): [facility] in [place] Reason for Treatment: SI Prior Outpatient Therapy: has an outpatient therapist .  Alcohol Screening: 1. How often do you have a drink containing alcohol?: Never 2. How many drinks containing alcohol do you have on a typical day when you are drinking?: 1 or 2 3. How often do you have six or more drinks on one occasion?: Never AUDIT-C Score: 0 4. How often during the last year have you found that you were not able to stop drinking once you had started?: Never 5. How often during the last year have you failed to do what was normally expected from you becasue of drinking?: Never 6. How often during the last year have you needed a first drink in the morning to get yourself going after a heavy drinking session?: Never 7. How often during the last year have you had a feeling of guilt of remorse after drinking?: Never 8. How often during the last year have you been unable to remember what happened the night before because you had been drinking?: Never 9. Have you or someone else been injured as a result of your drinking?: No 10. Has  a relative or friend or a doctor or another health worker been concerned about your drinking or suggested you cut down?: No Alcohol Use Disorder Identification Test Final Score (AUDIT): 0 Substance Abuse History in the last 12 months:  Reports history of alcohol abuse years ago, but no longer drinks, reports cannabis  use disorder, smokes almost daily. Denies other drug abuse . Consequences of Substance Abuse: Denies  Previous Psychotropic Medications: In the past had been on Remeron 15 mgrs QHS, Seroquel 50 mgrs QHS, Buspar 15 mgrs BID, Zoloft 150 mgrs QDAY , Propranolol 20 mgrs TID ( for anxiety and tremors). Psychological Evaluations:  No  Past Medical History: denies medical illnesses , NKDA.  He has been on Testosterone weekly, but states he has not been taking it in several months.  Past Medical History:  Diagnosis Date  . Anxiety   . Depression     Past Surgical History:  Procedure Laterality Date  . BRAIN SURGERY    . BREAST SURGERY    . CARDIAC SURGERY    . MASTECTOMY     double  . OVARIAN CYST REMOVAL     Family History: father alive, mother passed away from pneumonia when patient was 31. Has one twin sister, one full brother, 5 half siblings  Family History  Problem Relation Age of Onset  . Cancer Maternal Grandmother   . Cancer Maternal Grandfather    Family Psychiatric  History: reports there is a history of depression and anxiety in family members, no suicides in family, mother had history of opiate use disorder, history of alcohol use disorder in extended family  Tobacco Screening:  uses smokeless tobacco ( vapes )  Social History: 27 year old male, currently separated x 1 year , no children, lives with girlfriend, employed .  Social History   Substance and Sexual Activity  Alcohol Use No   Comment: weekly     Social History   Substance and Sexual Activity  Drug Use No    Additional Social History: Marital status: Separated    Pain Medications: denies Prescriptions: denies Over the Counter: denies History of alcohol / drug use?: Yes Name of Substance 1: marijuana 1 - Frequency: nightly (to sleep) 1 - Duration: ongoing 1 - Last Use / Amount: last night  Allergies:   Allergies  Allergen Reactions  . Morphine And Related Itching  . Oxycodone Itching and Other  (See Comments)    hot  . Sulfa Antibiotics Itching and Rash   Lab Results: No results found for this or any previous visit (from the past 48 hour(s)).  Blood Alcohol level:  No results found for: [facility]  Metabolic Disorder Labs:  No results found for: HGBA1C, MPG No results found for: PROLACTIN No results found for: CHOL, TRIG, HDL, CHOLHDL, VLDL, LDLCALC  Current Medications: No current facility-administered medications for this encounter.    PTA Medications: Medications Prior to Admission  Medication Sig Dispense Refill Last Dose  . azithromycin (ZITHROMAX) 250 MG tablet Take 2 pills today then one a day for 4 additional days (Patient not taking: Reported on 09/30/2017) 6 tablet 0 Not Taking at Unknown time    Musculoskeletal: Strength & Muscle Tone: within normal limits Gait & Station: unsteady Patient leans: N/A  Psychiatric Specialty Exam: Physical Exam  Review of Systems  Constitutional: Negative.   HENT: Negative.   Eyes: Negative.   Respiratory: Negative.   Cardiovascular: Negative.   Gastrointestinal: Negative.   Genitourinary: Negative.   Musculoskeletal: Negative.   Skin:  Negative.   Neurological: Negative for seizures and headaches.  Endo/Heme/Allergies: Negative.   Psychiatric/Behavioral: Positive for depression, substance abuse and suicidal ideas.  All other systems reviewed and are negative.   Blood pressure 119/89, pulse (!) 57, temperature 97.7 F (36.5 C), temperature source Oral, resp. rate 16, height  (1.676 m), weight 50.3 kg (111 lb), SpO2 100 %.Body mass index is 17.92 kg/m.  General Appearance: Well Groomed  Eye Contact:  Fair  Speech:  Normal Rate  Volume:  Normal  Mood:  Depressed  Affect:  Constricted and Tearful  Thought Process:  Linear and Descriptions of Associations: Intact  Orientation:  Other:  fully alert and attentive  Thought Content:  denies hallucinations , no delusions expressed   Suicidal Thoughts:  No- denies any  current suicidal plan or intention and contracts for safety on unit, denies any homicidal or violent ideations  Homicidal Thoughts:  No  Memory:  recent and remote grossly intact   Judgement:  Fair  Insight:  Fair  Psychomotor Activity:  Decreased  Concentration:  Concentration: Good and Attention Span: Good  Recall:  Good  Fund of Knowledge:  Good  Language:  Good  Akathisia:  Negative  Handed:  Right  AIMS (if indicated):     Assets:  Communication Skills Desire for Improvement Resilience  ADL's:  Intact  Cognition:  WNL  Sleep:       Treatment Plan Summary: Daily contact with patient to assess and evaluate symptoms and progress in treatment, Medication management, Plan inpatient treatment  and medications as below  Observation Level/Precautions:  15 minute checks  Laboratory:  CBC Chemistry Profile UA TSH  EKG  Psychotherapy:  Milieu, group therapy   Medications:  Start Zoloft 50 mgrs QHS and Remeron 7.5 mgrs QHS. He has been on these medications in the past and remembers them as helpful and well tolerated   Consultations:  As needed   Discharge Concerns: -  Estimated LOS: 5 days   Other:     Physician Treatment Plan for Primary Diagnosis: MDD, no psychotic features  Long Term Goal(s): Improvement in symptoms so as ready for discharge  Short Term Goals: Ability to identify changes in lifestyle to reduce recurrence of condition will improve and Ability to identify and develop effective coping behaviors will improve  Physician Treatment Plan for Secondary Diagnosis: Suicidal Ideations Long Term Goal(s): Improvement in symptoms so as ready for discharge  Short Term Goals: Ability to identify changes in lifestyle to reduce recurrence of condition will improve and Ability to maintain clinical measurements within normal limits will improve  I certify that inpatient services furnished can reasonably be expected to improve the patient's condition.    Craige Cotta,  MD 5/30/20193:10 PM

## 2017-09-30 NOTE — Progress Notes (Signed)
Patient ID: Aaron Rangel, adult   DOB: 03-May-1991, 27 y.o.   MRN: [id number] Patient is a 27 year old Trans man admitted on a voluntary status.  Patient endorses suicidal thought with no plan to act on thoughts.  Patient currently states his stressor is legally divorcing his wife and being unable to obtain a work life balance.    Skin assessment complete patient is status post top surgery to align gender with assigned sex at birth.  Patient also has multiple tattoos, but was noted to not have any injury to the body.    Patient admitted to the unit without any incident.

## 2017-09-30 NOTE — Tx Team (Signed)
Initial Treatment Plan 09/30/2017 2:55 PM Aaron Rangel [id number]    PATIENT STRESSORS: Marital or family conflict   PATIENT STRENGTHS: Ability for insight Capable of independent living Astronomer fund of knowledge Motivation for treatment/growth   PATIENT IDENTIFIED PROBLEMS: "I have been feeling like I have no work life balance."  I have been suicidal                    DISCHARGE CRITERIA:  Improved stabilization in mood, thinking, and/or behavior  PRELIMINARY DISCHARGE PLAN: Return to previous living arrangement  PATIENT/FAMILY INVOLVEMENT: This treatment plan has been presented to and reviewed with the patient, Aaron Rangel, and/or family member,  The patient and family have been given the opportunity to ask questions and make suggestions.  Jerrye Bushy, RN 09/30/2017, 2:55 PM

## 2017-09-30 NOTE — BHH Suicide Risk Assessment (Signed)
[facility] Admission Suicide Risk Assessment   Nursing information obtained from:  Patient Demographic factors:  Caucasian, Male, Cardell Peach, lesbian, or bisexual orientation Current Mental Status:  Suicidal ideation indicated by patient, Self-harm thoughts, Suicide plan Loss Factors:  Loss of significant relationship Historical Factors:  Prior suicide attempts, Family history of suicide, Family history of mental illness or substance abuse, Anniversary of important loss, Impulsivity Risk Reduction Factors:  Employed, Positive coping skills or problem solving skills, Living with another person, especially a relative, Sense of responsibility to family, Positive social support, Positive therapeutic relationship  Total Time spent with patient: 45 minutes Principal Problem:  Depression Diagnosis:   Patient Active Problem List   Diagnosis Date Noted  . MDD (major depressive disorder), recurrent severe, without psychosis (HCC) [F33.2] 09/30/2017   Subjective Data:   Continued Clinical Symptoms:  Alcohol Use Disorder Identification Test Final Score (AUDIT): 0 The "Alcohol Use Disorders Identification Test", Guidelines for Use in Primary Care, Second Edition.  World Science writer [facility]). Score between 0-7:  no or low risk or alcohol related problems. Score between 8-15:  moderate risk of alcohol related problems. Score between 16-19:  high risk of alcohol related problems. Score 20 or above:  warrants further diagnostic evaluation for alcohol dependence and treatment.   CLINICAL FACTORS:   27 year old transgender male, presents to hospital voluntarily due to worsening depression and suicidal ideations. Has been off psychiatric medications x several months    Psychiatric Specialty Exam: Physical Exam  ROS  Blood pressure 119/89, pulse (!) 57, temperature 97.7 F (36.5 C), temperature source Oral, resp. rate 16, height  (1.676 m), weight 50.3 kg (111 lb), SpO2 100 %.Body mass index is 17.92  kg/m.   see admit note MSE   COGNITIVE FEATURES THAT CONTRIBUTE TO RISK:  Closed-mindedness and Loss of executive function    SUICIDE RISK:   Moderate:  Frequent suicidal ideation with limited intensity, and duration, some specificity in terms of plans, no associated intent, good self-control, limited dysphoria/symptomatology, some risk factors present, and identifiable protective factors, including available and accessible social support.  PLAN OF CARE: Patient will be admitted to inpatient psychiatric unit for stabilization and safety. Will provide and encourage milieu participation. Provide medication management and maked adjustments as needed.  Will follow daily.    I certify that inpatient services furnished can reasonably be expected to improve the patient's condition.   Craige Cotta, MD 09/30/2017, 3:41 PM

## 2017-10-01 DIAGNOSIS — Z87891 Personal history of nicotine dependence: Secondary | ICD-10-CM

## 2017-10-01 DIAGNOSIS — G4700 Insomnia, unspecified: Secondary | ICD-10-CM

## 2017-10-01 LAB — CBC WITH DIFFERENTIAL/PLATELET
BASOS ABS: 0 10*3/uL (ref 0.0–0.1)
BASOS PCT: 1 %
EOS ABS: 0 10*3/uL (ref 0.0–0.7)
Eosinophils Relative: 1 %
HEMATOCRIT: 41.5 % (ref 39.0–52.0)
HEMOGLOBIN: 14.1 g/dL (ref 13.0–17.0)
Lymphocytes Relative: 36 %
Lymphs Abs: 2.4 10*3/uL (ref 0.7–4.0)
MCH: 29.6 pg (ref 26.0–34.0)
MCHC: 34 g/dL (ref 30.0–36.0)
MCV: 87 fL (ref 78.0–100.0)
MONOS PCT: 6 %
Monocytes Absolute: 0.4 10*3/uL (ref 0.1–1.0)
NEUTROS ABS: 3.8 10*3/uL (ref 1.7–7.7)
NEUTROS PCT: 56 %
Platelets: 230 10*3/uL (ref 150–400)
RBC: 4.77 MIL/uL (ref 4.22–5.81)
RDW: 11.9 % (ref 11.5–15.5)
WBC: 6.7 10*3/uL (ref 4.0–10.5)

## 2017-10-01 MED ORDER — ADULT MULTIVITAMIN W/MINERALS CH
1.0000 | ORAL_TABLET | Freq: Every day | ORAL | Status: DC
  Administered 2017-10-01 – 2017-10-04 (×4): 1 via ORAL
  Filled 2017-10-01 (×5): qty 1

## 2017-10-01 MED ORDER — MIRTAZAPINE 15 MG PO TABS
15.0000 mg | ORAL_TABLET | Freq: Every day | ORAL | Status: DC
  Administered 2017-10-01 – 2017-10-03 (×3): 15 mg via ORAL
  Filled 2017-10-01 (×6): qty 1

## 2017-10-01 MED ORDER — ENSURE ENLIVE PO LIQD
237.0000 mL | Freq: Two times a day (BID) | ORAL | Status: DC
  Administered 2017-10-02 – 2017-10-04 (×5): 237 mL via ORAL

## 2017-10-01 NOTE — Progress Notes (Signed)
D Pt is observed OOB UAL on the 400 hall. He tolerates this well. He is observed sitting in the  Dayroom socializing with his peers. He is isolative, quiet and does not initiate interaction  With staff.      A He completed his daily assessment and o this he wrote  He deneid SI today and he rated his depression, hopelessness and anxiety " 7/3/8", respectively.      R Safety is in place.

## 2017-10-01 NOTE — Plan of Care (Signed)
?  Problem: Education: ?Goal: Mental status will improve ?Outcome: Progressing ?Goal: Verbalization of understanding the information provided will improve ?Outcome: Progressing ?  ?

## 2017-10-01 NOTE — [facility] Group Notes (Signed)
[facility] LCSW Group Therapy Note  Date/Time: 10/01/17, 1315  Type of Therapy/Topic:  Group Therapy:  Balance in Life  Participation Level: active   Description of Group:    This group will address the concept of balance and how it feels and looks when one is unbalanced. Patients will be encouraged to process areas in their lives that are out of balance, and identify reasons for remaining unbalanced. Facilitators will guide patients utilizing problem- solving interventions to address and correct the stressor making their life unbalanced. Understanding and applying boundaries will be explored and addressed for obtaining  and maintaining a balanced life. Patients will be encouraged to explore ways to assertively make their unbalanced needs known to significant others in their lives, using other group members and facilitator for support and feedback.  Therapeutic Goals: 1. Patient will identify two or more emotions or situations they have that consume much of in their lives. 2. Patient will identify signs/triggers that life has become out of balance:  3. Patient will identify two ways to set boundaries in order to achieve balance in their lives:  4. Patient will demonstrate ability to communicate their needs through discussion and/or role plays  Summary of Patient Progress:Pt shared that work mental/emotional, and physical are areas that are out of balance in his life.  Pt was active in groups discussion about ways to take steps to address areas of life that get out of balance.           Therapeutic Modalities:   Cognitive Behavioral Therapy Solution-Focused Therapy Assertiveness Training  Daleen Squibb, [place]
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
## 2017-10-01 NOTE — Progress Notes (Signed)
[facility] MD Progress Note  10/01/2017 2:46 PM Aaron Rangel  MRN:  [id number] Subjective:  Reports he is feeling less depressed today. Denies suicidal ideations at this time. Denies medication side effects.  Objective: I have discussed case with treatment team and have met with patient. Patient is 27 year old transgender male, employed, lives with SO.  Presented for worsening depression, suicidal ideations . Today reports feeling better and presents with partially improved mood and a fuller range of affect. Not tearful at this time. Denies suicidal ideations and currently contracts for safety.  Denies medication side effects.  Visible on unit, going to group sessions. Behavior on unit calm and in good control, polite on approach.  Principal Problem: MDD Diagnosis:   Patient Active Problem List   Diagnosis Date Noted  . MDD (major depressive disorder), recurrent severe, without psychosis ([place]) [F33.2] 09/30/2017   Total Time spent with patient: 15 minutes  Past Psychiatric History:   Past Medical History:  Past Medical History:  Diagnosis Date  . Anxiety   . Depression     Past Surgical History:  Procedure Laterality Date  . BRAIN SURGERY    . BREAST SURGERY    . CARDIAC SURGERY    . MASTECTOMY     double  . OVARIAN CYST REMOVAL     Family History:  Family History  Problem Relation Age of Onset  . Cancer Maternal Grandmother   . Cancer Maternal Grandfather    Family Psychiatric  History:  Social History:  Social History   Substance and Sexual Activity  Alcohol Use No   Comment: weekly     Social History   Substance and Sexual Activity  Drug Use No    Social History   Socioeconomic History  . Marital status: Single    Spouse name: Not on file  . Number of children: Not on file  . Years of education: Not on file  . Highest education level: Not on file  Occupational History  . Not on file  Social Needs  . Financial resource strain: Not on file  . Food  insecurity:    Worry: Not on file    Inability: Not on file  . Transportation needs:    Medical: Not on file    Non-medical: Not on file  Tobacco Use  . Smoking status: Former Smoker    Types: E-cigarettes    Last attempt to quit: 09/30/2017  . Smokeless tobacco: Never Used  Substance and Sexual Activity  . Alcohol use: No    Comment: weekly  . Drug use: No  . Sexual activity: Yes  Lifestyle  . Physical activity:    Days per week: Not on file    Minutes per session: Not on file  . Stress: Not on file  Relationships  . Social connections:    Talks on phone: Not on file    Gets together: Not on file    Attends religious service: Not on file    Active member of club or organization: Not on file    Attends meetings of clubs or organizations: Not on file    Relationship status: Not on file  Other Topics Concern  . Not on file  Social History Narrative  . Not on file   Additional Social History:    Pain Medications: denies Prescriptions: denies Over the Counter: denies History of alcohol / drug use?: Yes Name of Substance 1: marijuana 1 - Frequency: nightly (to sleep) 1 - Duration: ongoing 1 -  Last Use / Amount: last night  Sleep: improving   Appetite:  improving   Current Medications: Current Facility-Administered Medications  Medication Dose Route Frequency Provider Last Rate Last Dose  . feeding supplement (ENSURE ENLIVE) (ENSURE ENLIVE) liquid 237 mL  237 mL Oral BID BM Cobos, Fernando A, MD      . LORazepam (ATIVAN) tablet 0.5 mg  0.5 mg Oral Q6H PRN Cobos, Myer Peer, MD   0.5 mg at 10/01/17 1404  . mirtazapine (REMERON) tablet 7.5 mg  7.5 mg Oral QHS Cobos, Myer Peer, MD   7.5 mg at 09/30/17 2108  . multivitamin with minerals tablet 1 tablet  1 tablet Oral Daily Cobos, Myer Peer, MD   1 tablet at 10/01/17 1404  . sertraline (ZOLOFT) tablet 50 mg  50 mg Oral Daily Cobos, Myer Peer, MD   50 mg at 10/01/17 0803    Lab Results:  Results for orders placed or  performed during the hospital encounter of 09/30/17 (from the past 48 hour(s))  Urinalysis, Complete w Microscopic     Status: None   Collection Time: 09/30/17  4:58 PM  Result Value Ref Range   Color, Urine YELLOW YELLOW   APPearance CLEAR CLEAR   Specific Gravity, Urine 1.019 1.005 - 1.030   pH 6.0 5.0 - 8.0   Glucose, UA NEGATIVE NEGATIVE mg/dL   Hgb urine dipstick NEGATIVE NEGATIVE   Bilirubin Urine NEGATIVE NEGATIVE   Ketones, ur NEGATIVE NEGATIVE mg/dL   Protein, ur NEGATIVE NEGATIVE mg/dL   Nitrite NEGATIVE NEGATIVE   Leukocytes, UA NEGATIVE NEGATIVE   RBC / HPF 0-5 0 - 5 RBC/hpf   WBC, UA 0-5 0 - 5 WBC/hpf   Bacteria, UA NONE SEEN NONE SEEN   Squamous Epithelial / LPF 0-5 0 - 5   Mucus PRESENT     Comment: Performed at [facility], [place] [address], [place], [place] [address]  Urine rapid drug screen (hosp performed)not at [facility]     Status: Abnormal   Collection Time: 09/30/17  4:58 PM  Result Value Ref Range   Opiates NONE DETECTED NONE DETECTED   Cocaine NONE DETECTED NONE DETECTED   Benzodiazepines NONE DETECTED NONE DETECTED   Amphetamines NONE DETECTED NONE DETECTED   Tetrahydrocannabinol POSITIVE (A) NONE DETECTED   Barbiturates NONE DETECTED NONE DETECTED    Comment: (NOTE) DRUG SCREEN FOR MEDICAL PURPOSES ONLY.  IF CONFIRMATION IS NEEDED FOR ANY PURPOSE, NOTIFY LAB WITHIN 5 DAYS. LOWEST DETECTABLE LIMITS FOR URINE DRUG SCREEN Drug Class                     Cutoff (ng/mL) Amphetamine and metabolites    1000 Barbiturate and metabolites    200 Benzodiazepine                 542 Tricyclics and metabolites     300 Opiates and metabolites        300 Cocaine and metabolites        300 THC                            50 Performed at [facility], [place] [address], [place], [place] [address]   CBC     Status: None   Collection Time: 09/30/17  6:45 PM  Result Value Ref Range   WBC 8.1 4.0 - 10.5 K/uL   RBC 4.68 4.22 - 5.81  MIL/uL   Hemoglobin  14.0 13.0 - 17.0 g/dL   HCT 40.0 39.0 - 52.0 %   MCV 85.5 78.0 - 100.0 fL   MCH 29.9 26.0 - 34.0 pg   MCHC 35.0 30.0 - 36.0 g/dL   RDW 12.0 11.5 - 15.5 %   Platelets 230 150 - 400 K/uL    Comment: Performed at [facility], [place] [address] [place], [place] [address]  Comprehensive metabolic panel     Status: Abnormal   Collection Time: 09/30/17  6:45 PM  Result Value Ref Range   Sodium 142 135 - 145 mmol/L   Potassium 4.0 3.5 - 5.1 mmol/L   Chloride 107 101 - 111 mmol/L   CO2 26 22 - 32 mmol/L   Glucose, Bld 88 65 - 99 mg/dL   BUN 15 6 - 20 mg/dL   Creatinine, Ser 0.71 0.61 - 1.24 mg/dL   Calcium 9.4 8.9 - 10.3 mg/dL   Total Protein 7.2 6.5 - 8.1 g/dL   Albumin 4.6 3.5 - 5.0 g/dL   AST 18 15 - 41 U/L   ALT 18 17 - 63 U/L   Alkaline Phosphatase 57 38 - 126 U/L   Total Bilirubin 1.4 (H) 0.3 - 1.2 mg/dL   GFR calc non Af Amer >60 >60 mL/min   GFR calc Af Amer >60 >60 mL/min    Comment: (NOTE) The eGFR has been calculated using the CKD EPI equation. This calculation has not been validated in all clinical situations. eGFR's persistently <60 mL/min signify possible Chronic Kidney Disease.    Anion gap 9 5 - 15    Comment: Performed at [facility], [place] [address] [place], [place] [address]  Hemoglobin A1c     Status: None   Collection Time: 09/30/17  6:45 PM  Result Value Ref Range   Hgb A1c MFr Bld 5.0 4.8 - 5.6 %    Comment: (NOTE) Pre diabetes:          5.7%-6.4% Diabetes:              >6.4% Glycemic control for   <7.0% adults with diabetes    Mean Plasma Glucose 96.8 mg/dL    Comment: Performed at [place] [address] [place], [place] [address]  Ethanol     Status: None   Collection Time: 09/30/17  6:45 PM  Result Value Ref Range   Alcohol, Ethyl (B) <10 <10 mg/dL    Comment: (NOTE) Lowest detectable limit for serum alcohol is 10 mg/dL. For medical purposes only. Performed at [facility], [place] [address], [place], [place] [address]   Lipid panel     Status: None   Collection Time: 09/30/17  6:45 PM  Result Value Ref Range   Cholesterol 159 0 - 200 mg/dL   Triglycerides 83 <150 mg/dL   HDL 62 >40 mg/dL   Total CHOL/HDL Ratio 2.6 RATIO   VLDL 17 0 - 40 mg/dL   LDL Cholesterol 80 0 - 99 mg/dL    Comment:        Total Cholesterol/HDL:CHD Risk Coronary Heart Disease Risk Table                     Men   Women  1/2 Average Risk   3.4   3.3  Average Risk       5.0   4.4  2 X Average Risk   9.6   7.1  3 X Average Risk  23.4   11.0        Use the calculated Patient Ratio above and the CHD Risk Table to determine the patient's CHD Risk.        ATP III CLASSIFICATION (LDL):  <100     mg/dL   Optimal  100-129  mg/dL   Near or Above                    Optimal  130-159  mg/dL   Borderline  160-189  mg/dL   High  >190     mg/dL   Very High Performed at [place] [address], [place], [place] [address]   TSH     Status: None   Collection Time: 09/30/17  6:45 PM  Result Value Ref Range   TSH 0.767 0.350 - 4.500 uIU/mL    Comment: Performed by a 3rd Generation assay with a functional sensitivity of <=0.01 uIU/mL. Performed at [facility], [place] [address] [place], [place] [address]   CBC with Differential/Platelet     Status: None   Collection Time: 10/01/17  7:52 AM  Result Value Ref Range   WBC 6.7 4.0 - 10.5 K/uL   RBC 4.77 4.22 - 5.81 MIL/uL   Hemoglobin 14.1 13.0 - 17.0 g/dL   HCT 41.5 39.0 - 52.0 %   MCV 87.0 78.0 - 100.0 fL   MCH 29.6 26.0 - 34.0 pg   MCHC 34.0 30.0 - 36.0 g/dL   RDW 11.9 11.5 - 15.5 %   Platelets 230 150 - 400 K/uL   Neutrophils Relative % 56 %   Neutro Abs 3.8 1.7 - 7.7 K/uL   Lymphocytes Relative 36 %   Lymphs Abs 2.4 0.7 - 4.0 K/uL   Monocytes Relative 6 %   Monocytes Absolute 0.4 0.1 - 1.0 K/uL   Eosinophils Relative 1 %   Eosinophils Absolute 0.0 0.0 - 0.7 K/uL    Basophils Relative 1 %   Basophils Absolute 0.0 0.0 - 0.1 K/uL    Comment: Performed at [facility], [place] [address] [place] [address], [place], [place] [address]    Blood Alcohol level:  Lab Results  Component Value Date   ETH <10 02/25/8526    Metabolic Disorder Labs: Lab Results  Component Value Date   HGBA1C 5.0 09/30/2017   MPG 96.8 09/30/2017   No results found for: PROLACTIN Lab Results  Component Value Date   CHOL 159 09/30/2017   TRIG 83 09/30/2017   HDL 62 09/30/2017   CHOLHDL 2.6 09/30/2017   VLDL 17 09/30/2017   LDLCALC 80 09/30/2017    Physical Findings: AIMS: Facial and Oral Movements Muscles of Facial Expression: None, normal Lips and Perioral Area: None, normal Jaw: None, normal Tongue: None, normal,Extremity Movements Upper (arms, wrists, hands, fingers): None, normal Lower (legs, knees, ankles, toes): None, normal, Trunk Movements Neck, shoulders, hips: None, normal, Overall Severity Severity of abnormal movements (highest score from questions above): None, normal Incapacitation due to abnormal movements: None, normal Patient's awareness of abnormal movements (rate only patient's report): No Awareness, Dental Status Current problems with teeth and/or dentures?: No Does patient usually wear dentures?: No  CIWA:    COWS:     Musculoskeletal: Strength & Muscle Tone: within normal limits Gait & Station: normal Patient leans: N/A  Psychiatric Specialty Exam: Physical Exam  ROS denies headache, no chest pain, no shortness of breath, no vomiting   Blood pressure 120/83, pulse (!) 56, temperature 97.6 F (  36.4 C), temperature source Oral, resp. rate 16, height _0  (1.676 m), weight 50.3 kg (111 lb), SpO2 (!) 89 %.Body mass index is 17.92 kg/m.  General Appearance: improving grooming   Eye Contact:  Good  Speech:  Normal Rate  Volume:  Normal  Mood:  improving , less depressed   Affect:  less constricted, not tearful today, smiles briefly at  times   Thought Process:  Linear and Descriptions of Associations: Intact  Orientation:  Full (Time, Place, and Person)  Thought Content:  denies hallucinations, no delusions, not internally preoccupied  Suicidal Thoughts:  No denies suicidal or self injurious ideations, denies homicidal or violent ideations, contracts for safety on unit  Homicidal Thoughts:  No  Memory:  recent and remote grossly intact   Judgement:  Other:  improving   Insight:  improving   Psychomotor Activity:  Normal  Concentration:  Concentration: Good and Attention Span: Good  Recall:  Good  Fund of Knowledge:  Good  Language:  Good  Akathisia:  Negative  Handed:  Right  AIMS (if indicated):     Assets:  Communication Skills Desire for Improvement Resilience  ADL's:  Intact  Cognition:  WNL  Sleep:      Assessment - patient is presenting with partially improved mood and range of affect . Denies suicidal ideations. Thus far tolerating Zoloft/Remeron well , without side effects.   Treatment Plan Summary: Daily contact with patient to assess and evaluate symptoms and progress in treatment, Medication management, Plan inpatient treatment  and medications as below Encourage group and milieu participation to work on coping skills and symptom reduction Continue Zoloft 50 mgrs QDAY for depression Increase Remeron to 15 mgrs QHS for insomnia as needed  Continue Ativan 0.5 mgrs Q 6 hours PRN for anxiety as needed  Continue Ensure dietary supplements  Treatment team working on disposition planning options Jenne Campus, MD 10/01/2017, 2:46 PM

## 2017-10-01 NOTE — Progress Notes (Signed)
Recreation Therapy Notes  Date: 5.31.19 Time: 0930 Location: 400 Hall Dayroom  Group Topic: Stress Management  Goal Area(s) Addresses:  Patient will verbalize importance of using healthy stress management.  Patient will identify positive emotions associated with healthy stress management.   Behavioral Response: Engaged  Intervention: Stress Management  Activity :  Progressive Muscle Relaxation.  LRT lead group in progressive muscle relaxation.  Patients were to tense each muscle one at a time and then release the tension.  Patients were to follow along as LRT lead them through the activity.    Education:  Stress Management, Discharge Planning.   Education Outcome: Acknowledges edcuation/In group clarification offered/Needs additional education  Clinical Observations/Feedback:  Pt attended and participated in group.      Caroll Rancher, LRT/CTRS         Lillia Abed, Zaden Sako A 10/01/2017 11:21 AM

## 2017-10-01 NOTE — Progress Notes (Signed)
NUTRITION ASSESSMENT  Pt identified as at risk on the Malnutrition Screen Tool  INTERVENTION: 1. Educated patient on the importance of nutrition and encouraged intake of food and beverages. 2. Discussed weight goals. 3. Supplements:  - will Ensure Enlive BID, each supplement provides 350 kcal and 20 grams of protein. - will order daily multivitamin with minerals.  NUTRITION DIAGNOSIS: Unintentional weight loss related to sub-optimal intake as evidenced by pt report.   Goal: Pt to meet >/= 90% of their estimated nutrition needs.  Monitor:  PO intake  Assessment:  Pt admitted for SI.Pt with hx of depression and anxiety and he stopped taking his medications 10 months PT d/t financial constraints and no insurance. Pt reports SI for the past 3 months.  Limited weight hx available. Last weight in the chart is from [facility] on 08/08/15 when he weighed 131 lbs. Based on current weight, this indicates 20 lb weight loss (15% body weight) in the past 2 years. This is not significant for time frame. Pt's underweight status is concerning, though.    27 y.o. adult  Height: Ht Readings from Last 1 Encounters:  09/30/17 5\' 6"  (1.676 m)    Weight: Wt Readings from Last 1 Encounters:  09/30/17 111 lb (50.3 kg)    Weight Hx: Wt Readings from Last 10 Encounters:  09/30/17 111 lb (50.3 kg)  10/28/13 127 lb (57.6 kg)  06/02/13 125 lb (56.7 kg)  12/14/12 124 lb (56.2 kg)  06/18/12 126 lb (57.2 kg)    BMI:  Body mass index is 17.92 kg/m. Pt meets criteria for underweight based on current BMI.  Estimated Nutritional Needs: Kcal: 25-30 kcal/kg Protein: > 1 gram protein/kg Fluid: 1 ml/kcal  Diet Order:  Diet Order           Diet regular Room service appropriate? Yes; Fluid consistency: Thin  Diet effective now         Pt is also offered choice of unit snacks mid-morning and mid-afternoon.  Pt is eating as desired.   Lab results and medications reviewed.      Trenton GammonJessica Analyssa Downs,  MS, RD, LDN, [facility] Inpatient Clinical Dietitian Pager # [phone] After hours/weekend pager # [phone]

## 2017-10-01 NOTE — Plan of Care (Signed)
  Problem: Education: Goal: Mental status will improve Outcome: Progressing   

## 2017-10-01 NOTE — Progress Notes (Signed)
Pt attended group this evening. 

## 2017-10-01 NOTE — Tx Team (Signed)
Interdisciplinary Treatment and Diagnostic Plan Update  10/01/2017 Time of Session: [place] MRN: [id number]  Principal Diagnosis: <principal problem not specified>  Secondary Diagnoses: Active Problems:   MDD (major depressive disorder), recurrent severe, without psychosis ([place])   Current Medications:  Current Facility-Administered Medications  Medication Dose Route Frequency Provider Last Rate Last Dose  . feeding supplement (ENSURE ENLIVE) (ENSURE ENLIVE) liquid 237 mL  237 mL Oral BID BM Cobos, Fernando A, MD      . LORazepam (ATIVAN) tablet 0.5 mg  0.5 mg Oral Q6H PRN Cobos, Myer Peer, MD   0.5 mg at 09/30/17 1653  . mirtazapine (REMERON) tablet 7.5 mg  7.5 mg Oral QHS Cobos, Myer Peer, MD   7.5 mg at 09/30/17 2108  . multivitamin with minerals tablet 1 tablet  1 tablet Oral Daily Cobos, Fernando A, MD      . sertraline (ZOLOFT) tablet 50 mg  50 mg Oral Daily Cobos, Myer Peer, MD   50 mg at 10/01/17 0803   PTA Medications: Medications Prior to Admission  Medication Sig Dispense Refill Last Dose  . azithromycin (ZITHROMAX) 250 MG tablet Take 2 pills today then one a day for 4 additional days (Patient not taking: Reported on 09/30/2017) 6 tablet 0 Not Taking at Unknown time    Patient Stressors: Marital or family conflict  Patient Strengths: Ability for insight Capable of independent living [place] of knowledge Motivation for treatment/growth  Treatment Modalities: Medication Management, Group therapy, Case management,  1 to 1 session with clinician, Psychoeducation, Recreational therapy.   Physician Treatment Plan for Primary Diagnosis: <principal problem not specified> Long Term Goal(s): Improvement in symptoms so as ready for discharge Improvement in symptoms so as ready for discharge   Short Term Goals: Ability to identify changes in lifestyle to reduce recurrence of condition will improve Ability to identify and develop effective  coping behaviors will improve Ability to identify changes in lifestyle to reduce recurrence of condition will improve Ability to maintain clinical measurements within normal limits will improve  Medication Management: Evaluate patient's response, side effects, and tolerance of medication regimen.  Therapeutic Interventions: 1 to 1 sessions, Unit Group sessions and Medication administration.  Evaluation of Outcomes: Not Met  Physician Treatment Plan for Secondary Diagnosis: Active Problems:   MDD (major depressive disorder), recurrent severe, without psychosis ([place])  Long Term Goal(s): Improvement in symptoms so as ready for discharge Improvement in symptoms so as ready for discharge   Short Term Goals: Ability to identify changes in lifestyle to reduce recurrence of condition will improve Ability to identify and develop effective coping behaviors will improve Ability to identify changes in lifestyle to reduce recurrence of condition will improve Ability to maintain clinical measurements within normal limits will improve     Medication Management: Evaluate patient's response, side effects, and tolerance of medication regimen.  Therapeutic Interventions: 1 to 1 sessions, Unit Group sessions and Medication administration.  Evaluation of Outcomes: Not Met   RN Treatment Plan for Primary Diagnosis: <principal problem not specified> Long Term Goal(s): Knowledge of disease and therapeutic regimen to maintain health will improve  Short Term Goals: Ability to identify and develop effective coping behaviors will improve and Compliance with prescribed medications will improve  Medication Management: RN will administer medications as ordered by provider, will assess and evaluate patient's response and provide education to patient for prescribed medication. RN will report any adverse and/or side effects to prescribing provider.  Therapeutic Interventions: 1 on 1 counseling  sessions,  Psychoeducation, Medication administration, Evaluate responses to treatment, Monitor vital signs and CBGs as ordered, Perform/monitor CIWA, COWS, AIMS and Fall Risk screenings as ordered, Perform wound care treatments as ordered.  Evaluation of Outcomes: Not Met   LCSW Treatment Plan for Primary Diagnosis: <principal problem not specified> Long Term Goal(s): Safe transition to appropriate next level of care at discharge, Engage patient in therapeutic group addressing interpersonal concerns.  Short Term Goals: Engage patient in aftercare planning with referrals and resources, Increase social support and Increase skills for wellness and recovery  Therapeutic Interventions: Assess for all discharge needs, 1 to 1 time with Social worker, Explore available resources and support systems, Assess for adequacy in community support network, Educate family and significant other(s) on suicide prevention, Complete Psychosocial Assessment, Interpersonal group therapy.  Evaluation of Outcomes: Not Met   Progress in Treatment: Attending groups: No. Participating in groups: No. Taking medication as prescribed: Yes. Toleration medication: Yes. Family/Significant other contact made: No, will contact:  when given permission Patient understands diagnosis: Yes. Discussing patient identified problems/goals with staff: Yes. Medical problems stabilized or resolved: Yes. Denies suicidal/homicidal ideation: Yes. Issues/concerns per patient self-inventory: No. Other: none  New problem(s) identified: No, Describe:  none  New Short Term/Long Term Goal(s):  Patient Goals:  "get back on my medication"  Discharge Plan or Barriers:   Reason for Continuation of Hospitalization: Anxiety Depression Medication stabilization  Estimated Length of Stay: 3-5 days.  Attendees: Patient:Aaron Rangel 10/01/2017   Physician: Dr Parke Poisson, MD 10/01/2017   Nursing: Vira Browns, RN 10/01/2017   RN Care Manager: 10/01/2017    Social Worker: Lurline Idol, LCSW 10/01/2017   Recreational Therapist:  10/01/2017   Other:  10/01/2017   Other:  10/01/2017   Other: 10/01/2017        Scribe for Treatment Team: Joanne Chars, LCSW 10/01/2017 10:49 AM

## 2017-10-01 NOTE — Progress Notes (Signed)
Patient stated that the intake process took a long time and that he was pleased with the fact that he is back on his medication. His goal for tomorrow is to receive more therapy.

## 2017-10-01 NOTE — [facility] Group Notes (Signed)
[facility] Group Notes:  (Nursing/MHT/Case Management/Adjunct)  Date:  10/01/2017  Time:  5:44 PM Type of Therapy:  Psychoeducational Skills  Participation Level:  Did Not Attend  Participation Quality:  DID NOT ATTEND  Affect:  DID NOT ATTEND  Cognitive:  DID NOT ATTEND  Insight:  None  Engagement in Group:  DID NOT ATTEND  Modes of Intervention:  DID NOT ATTEND  Summary of Progress/Problems: Pt did not attend patient self inventory group.   Bethann Punches 10/01/2017, 5:44 PM

## 2017-10-02 MED ORDER — BUSPIRONE HCL 5 MG PO TABS
5.0000 mg | ORAL_TABLET | Freq: Two times a day (BID) | ORAL | Status: DC
  Administered 2017-10-02 – 2017-10-04 (×5): 5 mg via ORAL
  Filled 2017-10-02 (×7): qty 1

## 2017-10-02 MED ORDER — HYDROXYZINE HCL 25 MG PO TABS
25.0000 mg | ORAL_TABLET | Freq: Three times a day (TID) | ORAL | Status: DC | PRN
  Administered 2017-10-02 – 2017-10-03 (×3): 25 mg via ORAL
  Filled 2017-10-02 (×2): qty 1
  Filled 2017-10-02: qty 10
  Filled 2017-10-02: qty 1

## 2017-10-02 MED ORDER — SERTRALINE HCL 100 MG PO TABS
100.0000 mg | ORAL_TABLET | Freq: Every day | ORAL | Status: DC
  Administered 2017-10-03 – 2017-10-04 (×2): 100 mg via ORAL
  Filled 2017-10-02 (×3): qty 1

## 2017-10-02 NOTE — Progress Notes (Signed)
D pt is observed at the med window this am. He is cooperative, pleasant and says " I didn't sleep that good last night". His affect is flat, depressed and his skin color is poor. He does make direct eye contact with this Clinical research associatewriter. He says " I need something for diarrhea.Peggye Form.I've  had it two times already".      A He completed his daily assessment and on this he wrote he denied having SI today and he rated his depression, hopelessness and anxiety  4/2/6", respectively. He is encouraged by this Clinical research associatewriter to identify a goal for the day.     R Safety is in place.

## 2017-10-02 NOTE — Progress Notes (Signed)
Patient ID: Aaron Rangel, adult   DOB: [birth year], 27 y.o.   MRN: [id number] DAR Note: Pt observed in the dayroom flat and withdrawn to self. Pt at the time of assessment denied all, "I feel better, I am ready to go home." Pt denied SI and contracts for safety. All patient's questions and concerns addressed. Support, encouragement, and safe environment provided. Will continue to monitor for any changes. Pt was med compliant. Pt attended wrap-up group. Safety checks continue.

## 2017-10-02 NOTE — Progress Notes (Addendum)
[facility] MD Progress Note  10/02/2017 9:28 AM Aaron Rangel  MRN:  [id number]   Subjective: Patient reports that he is still feeling mildly depressed today and rates his depression at a 4 on a scale of 0-10 with 10 being the worst.  He also states that he is feeling restless today and rates his anxiety as a 3 out of 10.  He reports some diarrhea that started yesterday and has worsened this morning.  He does not think that it is the medications because he has been on them in the past.  He feels that it is possibly the food in the cafeteria because he does not eat salty or greasy food at home.  He also reported some poor disrupted sleep last night.  He reports that it took him an hour or 2 to get to sleep last night and then he felt like as though he tossed and turned all night.  He is requesting to be restarted on his testosterone injections and he states that his last injection was 9 months ago.  He states that he is going to follow-up with Dr. Brigitte Pulse on June 7 for continued treatment with testosterone.  He reports that he feels this may be 1 of his issues as he has had a full hysterectomy due to his transgender of male to male.  Patient does deny any SI/HI/AVH and contracts for safety.  Patient also states that he would like to know when he is going to discharge.  Objective: Patient's chart and findings reviewed and discussed with treatment team.  Patient presents in the hallway and is pleasant and cooperative.  Patient has been seen in the day room interacting with peers and staff appropriately.  After discussion patient's symptoms and patient's complaints have made several changes to medications.  Patient did state he did not want any controlled substances as he has an addictive personality and would prefer to stay away from Ativan.  So the Ativan will be discontinued today.  I requested nursing staff to arrange for patient to get salads for supper and lunch.  We will also increase patient's Zoloft to 100 mg a  day and have restarted his BuSpar at 5 mg twice a day.  Patient states she would like to be back on the same medications he was on in the past and patient is informed that the doses have to be started lower and a half to be titrated up and that we do not want to add all the medications back at one time.  Patient is in agreement with this plan.   Principal Problem: MDD (major depressive disorder), recurrent severe, without psychosis ([place]) Diagnosis:   Patient Active Problem List   Diagnosis Date Noted  . MDD (major depressive disorder), recurrent severe, without psychosis ([place]) [F33.2] 09/30/2017   Total Time spent with patient: 30 minutes  Past Psychiatric History: See H&P  Past Medical History:  Past Medical History:  Diagnosis Date  . Anxiety   . Depression     Past Surgical History:  Procedure Laterality Date  . BRAIN SURGERY    . BREAST SURGERY    . CARDIAC SURGERY    . MASTECTOMY     double  . OVARIAN CYST REMOVAL     Family History:  Family History  Problem Relation Age of Onset  . Cancer Maternal Grandmother   . Cancer Maternal Grandfather    Family Psychiatric  History: See H&P Social History:  Social History   Substance and Sexual  Activity  Alcohol Use No   Comment: weekly     Social History   Substance and Sexual Activity  Drug Use No    Social History   Socioeconomic History  . Marital status: Single    Spouse name: Not on file  . Number of children: Not on file  . Years of education: Not on file  . Highest education level: Not on file  Occupational History  . Not on file  Social Needs  . Financial resource strain: Not on file  . Food insecurity:    Worry: Not on file    Inability: Not on file  . Transportation needs:    Medical: Not on file    Non-medical: Not on file  Tobacco Use  . Smoking status: Former Smoker    Types: E-cigarettes    Last attempt to quit: 09/30/2017  . Smokeless tobacco: Never Used  Substance and Sexual Activity   . Alcohol use: No    Comment: weekly  . Drug use: No  . Sexual activity: Yes  Lifestyle  . Physical activity:    Days per week: Not on file    Minutes per session: Not on file  . Stress: Not on file  Relationships  . Social connections:    Talks on phone: Not on file    Gets together: Not on file    Attends religious service: Not on file    Active member of club or organization: Not on file    Attends meetings of clubs or organizations: Not on file    Relationship status: Not on file  Other Topics Concern  . Not on file  Social History Narrative  . Not on file   Additional Social History:    Pain Medications: denies Prescriptions: denies Over the Counter: denies History of alcohol / drug use?: Yes Name of Substance 1: marijuana 1 - Frequency: nightly (to sleep) 1 - Duration: ongoing 1 - Last Use / Amount: last night                  Sleep: Disrupted and took 1-2 hours to get to sleep  Appetite:  Good  Current Medications: Current Facility-Administered Medications  Medication Dose Route Frequency Provider Last Rate Last Dose  . busPIRone (BUSPAR) tablet 5 mg  5 mg Oral BID Money, Lowry Ram, FNP      . feeding supplement (ENSURE ENLIVE) (ENSURE ENLIVE) liquid 237 mL  237 mL Oral BID BM Cobos, Fernando A, MD   237 mL at 10/02/17 0818  . hydrOXYzine (ATARAX/VISTARIL) tablet 25 mg  25 mg Oral TID PRN Money, Lowry Ram, FNP      . mirtazapine (REMERON) tablet 15 mg  15 mg Oral QHS Cobos, Myer Peer, MD   15 mg at 10/01/17 2129  . multivitamin with minerals tablet 1 tablet  1 tablet Oral Daily Cobos, Myer Peer, MD   1 tablet at 10/02/17 0818  . [START ON 10/03/2017] sertraline (ZOLOFT) tablet 100 mg  100 mg Oral Daily Money, Lowry Ram, FNP        Lab Results:  Results for orders placed or performed during the hospital encounter of 09/30/17 (from the past 48 hour(s))  Urinalysis, Complete w Microscopic     Status: None   Collection Time: 09/30/17  4:58 PM  Result  Value Ref Range   Color, Urine YELLOW YELLOW   APPearance CLEAR CLEAR   Specific Gravity, Urine 1.019 1.005 - 1.030   pH 6.0 5.0 - 8.0  Glucose, UA NEGATIVE NEGATIVE mg/dL   Hgb urine dipstick NEGATIVE NEGATIVE   Bilirubin Urine NEGATIVE NEGATIVE   Ketones, ur NEGATIVE NEGATIVE mg/dL   Protein, ur NEGATIVE NEGATIVE mg/dL   Nitrite NEGATIVE NEGATIVE   Leukocytes, UA NEGATIVE NEGATIVE   RBC / HPF 0-5 0 - 5 RBC/hpf   WBC, UA 0-5 0 - 5 WBC/hpf   Bacteria, UA NONE SEEN NONE SEEN   Squamous Epithelial / LPF 0-5 0 - 5   Mucus PRESENT     Comment: Performed at [facility], [place] [address] [place], [place] [address]  Urine rapid drug screen (hosp performed)not at [facility]     Status: Abnormal   Collection Time: 09/30/17  4:58 PM  Result Value Ref Range   Opiates NONE DETECTED NONE DETECTED   Cocaine NONE DETECTED NONE DETECTED   Benzodiazepines NONE DETECTED NONE DETECTED   Amphetamines NONE DETECTED NONE DETECTED   Tetrahydrocannabinol POSITIVE (A) NONE DETECTED   Barbiturates NONE DETECTED NONE DETECTED    Comment: (NOTE) DRUG SCREEN FOR MEDICAL PURPOSES ONLY.  IF CONFIRMATION IS NEEDED FOR ANY PURPOSE, NOTIFY LAB WITHIN 5 DAYS. LOWEST DETECTABLE LIMITS FOR URINE DRUG SCREEN Drug Class                     Cutoff (ng/mL) Amphetamine and metabolites    1000 Barbiturate and metabolites    200 Benzodiazepine                 482 Tricyclics and metabolites     300 Opiates and metabolites        300 Cocaine and metabolites        300 THC                            50 Performed at [facility], [place] [address] [place], [place] [address]   CBC     Status: None   Collection Time: 09/30/17  6:45 PM  Result Value Ref Range   WBC 8.1 4.0 - 10.5 K/uL   RBC 4.68 4.22 - 5.81 MIL/uL   Hemoglobin 14.0 13.0 - 17.0 g/dL   HCT 40.0 39.0 - 52.0 %   MCV 85.5 78.0 - 100.0 fL   MCH 29.9 26.0 - 34.0 pg   MCHC 35.0 30.0 - 36.0 g/dL   RDW 12.0 11.5 - 15.5 %    Platelets 230 150 - 400 K/uL    Comment: Performed at [facility], [place] [address] [place], [place] [address]  Comprehensive metabolic panel     Status: Abnormal   Collection Time: 09/30/17  6:45 PM  Result Value Ref Range   Sodium 142 135 - 145 mmol/L   Potassium 4.0 3.5 - 5.1 mmol/L   Chloride 107 101 - 111 mmol/L   CO2 26 22 - 32 mmol/L   Glucose, Bld 88 65 - 99 mg/dL   BUN 15 6 - 20 mg/dL   Creatinine, Ser 0.71 0.61 - 1.24 mg/dL   Calcium 9.4 8.9 - 10.3 mg/dL   Total Protein 7.2 6.5 - 8.1 g/dL   Albumin 4.6 3.5 - 5.0 g/dL   AST 18 15 - 41 U/L   ALT 18 17 - 63 U/L   Alkaline Phosphatase 57 38 - 126 U/L   Total Bilirubin 1.4 (H) 0.3 - 1.2 mg/dL   GFR calc non Af Amer >60 >60 mL/min   GFR calc Af Amer >  60 >60 mL/min    Comment: (NOTE) The eGFR has been calculated using the CKD EPI equation. This calculation has not been validated in all clinical situations. eGFR's persistently <60 mL/min signify possible Chronic Kidney Disease.    Anion gap 9 5 - 15    Comment: Performed at [facility], [place] [address], [place], [place] [address]  Hemoglobin A1c     Status: None   Collection Time: 09/30/17  6:45 PM  Result Value Ref Range   Hgb A1c MFr Bld 5.0 4.8 - 5.6 %    Comment: (NOTE) Pre diabetes:          5.7%-6.4% Diabetes:              >6.4% Glycemic control for   <7.0% adults with diabetes    Mean Plasma Glucose 96.8 mg/dL    Comment: Performed at [place] [address], [place], [place] [address]  Ethanol     Status: None   Collection Time: 09/30/17  6:45 PM  Result Value Ref Range   Alcohol, Ethyl (B) <10 <10 mg/dL    Comment: (NOTE) Lowest detectable limit for serum alcohol is 10 mg/dL. For medical purposes only. Performed at [facility], [place] [address] [place], [place] [address]   Lipid panel     Status: None   Collection Time: 09/30/17  6:45 PM  Result Value Ref Range   Cholesterol 159 0 -  200 mg/dL   Triglycerides 83 <150 mg/dL   HDL 62 >40 mg/dL   Total CHOL/HDL Ratio 2.6 RATIO   VLDL 17 0 - 40 mg/dL   LDL Cholesterol 80 0 - 99 mg/dL    Comment:        Total Cholesterol/HDL:CHD Risk Coronary Heart Disease Risk Table                     Men   Women  1/2 Average Risk   3.4   3.3  Average Risk       5.0   4.4  2 X Average Risk   9.6   7.1  3 X Average Risk  23.4   11.0        Use the calculated Patient Ratio above and the CHD Risk Table to determine the patient's CHD Risk.        ATP III CLASSIFICATION (LDL):  <100     mg/dL   Optimal  100-129  mg/dL   Near or Above                    Optimal  130-159  mg/dL   Borderline  160-189  mg/dL   High  >190     mg/dL   Very High Performed at [place] [address], [place], [place] [address]   TSH     Status: None   Collection Time: 09/30/17  6:45 PM  Result Value Ref Range   TSH 0.767 0.350 - 4.500 uIU/mL    Comment: Performed by a 3rd Generation assay with a functional sensitivity of <=0.01 uIU/mL. Performed at [facility], [place] [address], [place], [place] [address]   CBC with Differential/Platelet     Status: None   Collection Time: 10/01/17  7:52 AM  Result Value Ref Range   WBC 6.7 4.0 - 10.5 K/uL   RBC 4.77 4.22 - 5.81 MIL/uL   Hemoglobin 14.1 13.0 - 17.0 g/dL   HCT  41.5 39.0 - 52.0 %   MCV 87.0 78.0 - 100.0 fL   MCH 29.6 26.0 - 34.0 pg   MCHC 34.0 30.0 - 36.0 g/dL   RDW 11.9 11.5 - 15.5 %   Platelets 230 150 - 400 K/uL   Neutrophils Relative % 56 %   Neutro Abs 3.8 1.7 - 7.7 K/uL   Lymphocytes Relative 36 %   Lymphs Abs 2.4 0.7 - 4.0 K/uL   Monocytes Relative 6 %   Monocytes Absolute 0.4 0.1 - 1.0 K/uL   Eosinophils Relative 1 %   Eosinophils Absolute 0.0 0.0 - 0.7 K/uL   Basophils Relative 1 %   Basophils Absolute 0.0 0.0 - 0.1 K/uL    Comment: Performed at [facility], [place] [address] [place], [place] [address]    Blood Alcohol  level:  Lab Results  Component Value Date   ETH <10 31/54/0086    Metabolic Disorder Labs: Lab Results  Component Value Date   HGBA1C 5.0 09/30/2017   MPG 96.8 09/30/2017   No results found for: PROLACTIN Lab Results  Component Value Date   CHOL 159 09/30/2017   TRIG 83 09/30/2017   HDL 62 09/30/2017   CHOLHDL 2.6 09/30/2017   VLDL 17 09/30/2017   LDLCALC 80 09/30/2017    Physical Findings: AIMS: Facial and Oral Movements Muscles of Facial Expression: None, normal Lips and Perioral Area: None, normal Jaw: None, normal Tongue: None, normal,Extremity Movements Upper (arms, wrists, hands, fingers): None, normal Lower (legs, knees, ankles, toes): None, normal, Trunk Movements Neck, shoulders, hips: None, normal, Overall Severity Severity of abnormal movements (highest score from questions above): None, normal Incapacitation due to abnormal movements: None, normal Patient's awareness of abnormal movements (rate only patient's report): No Awareness, Dental Status Current problems with teeth and/or dentures?: No Does patient usually wear dentures?: No  CIWA:    COWS:     Musculoskeletal: Strength & Muscle Tone: within normal limits Gait & Station: normal Patient leans: N/A  Psychiatric Specialty Exam: Physical Exam  Nursing note and vitals reviewed. Constitutional: He is oriented to person, place, and time. He appears well-developed and well-nourished.  Cardiovascular: Normal rate.  Respiratory: Effort normal.  Musculoskeletal: Normal range of motion.  Neurological: He is alert and oriented to person, place, and time.  Skin: Skin is warm.    Review of Systems  Constitutional: Negative.   HENT: Negative.   Eyes: Negative.   Respiratory: Negative.   Cardiovascular: Negative.   Gastrointestinal: Positive for diarrhea.  Genitourinary: Negative.   Musculoskeletal: Negative.   Skin: Negative.   Neurological: Negative.   Endo/Heme/Allergies: Negative.    Psychiatric/Behavioral: Positive for depression. Negative for hallucinations, substance abuse and suicidal ideas. The patient is nervous/anxious and has insomnia.     Blood pressure 109/78, pulse 68, temperature 98.5 F (36.9 C), temperature source Oral, resp. rate 20, height '5\' 6"'  (1.676 m), weight 50.3 kg (111 lb), SpO2 (!) 89 %.Body mass index is 17.92 kg/m.  General Appearance: Casual  Eye Contact:  Good  Speech:  Clear and Coherent and Normal Rate  Volume:  Normal  Mood:  Depressed  Affect:  Congruent  Thought Process:  Goal Directed and Descriptions of Associations: Intact  Orientation:  Full (Time, Place, and Person)  Thought Content:  WDL  Suicidal Thoughts:  No  Homicidal Thoughts:  No  Memory:  Immediate;   Good Recent;   Good Remote;   Good  Judgement:  Fair  Insight:  Good  Psychomotor  Activity:  Normal  Concentration:  Concentration: Good and Attention Span: Good  Recall:  Good  Fund of Knowledge:  Good  Language:  Good  Akathisia:  No  Handed:  Right  AIMS (if indicated):     Assets:  Communication Skills Desire for Improvement Financial Resources/Insurance Housing Physical Health Social Support Transportation  ADL's:  Intact  Cognition:  WNL  Sleep:      Problems addressed MDD severe recurrent  Treatment Plan Summary: Daily contact with patient to assess and evaluate symptoms and progress in treatment, Medication management and Plan is to: -Start BuSpar 5 mg p.o. twice daily for anxiety -Start Vistaril 25 mg p.o. 3 times daily as needed for anxiety -Continue Remeron 15 mg p.o. nightly for mood stability and anxiety -Increase Zoloft 100 mg p.o. daily for mood stability -Patient is getting information about his testosterone shots and we will discuss whether to restart them at this visit are not -Encourage group therapy participation  Lewis Shock, FNP 10/02/2017, 9:28 AM   ..Marland KitchenAgree with NP Progress Note

## 2017-10-02 NOTE — Progress Notes (Signed)
Adult Psychoeducational Group Note  Date:  10/02/2017 Time:  9:28 AM  Group Topic/Focus:  Goals Group:   The focus of this group is to help patients establish daily goals to achieve during treatment and discuss how the patient can incorporate goal setting into their daily lives to aide in recovery.  Participation Level:  Active  Participation Quality:  Attentive  Affect:  Appropriate  Cognitive:  Appropriate  Insight: Good  Engagement in Group:  Engaged  Modes of Intervention:  Discussion  Additional Comments:  Pt was very active in group today  Aaron Rangel, Aaron Rangel 10/02/2017, 9:28 AM

## 2017-10-02 NOTE — BHH Group Notes (Signed)
    Recreation ActivityDate:  10/02/2017  Time:  7:25 PM  Type of Therapy:  BINGO  :  The purpose of the group is to create a forum where patients can experience laughter in a benign Bingo game  Participation Level:  Active  Participation Quality:  Appropriate  Affect:  Appropriate  Cognitive:  Alert  Insight:  Improving  Engagement in Group:  Engaged  Modes of Intervention:  Rapport Building  Summary of Progress/Problems:  Aaron BraveDuke, Aaron Rangel Aaron Rangel 10/02/2017, 7:25 PM

## 2017-10-02 NOTE — Plan of Care (Signed)
  Problem: Education: Goal: Verbalization of understanding the information provided will improve Outcome: Progressing   

## 2017-10-02 NOTE — BHH Group Notes (Signed)
LCSW Group Therapy Note  10/02/2017    9:15 - 10:15 AM               Type of Therapy and Topic:  Group Therapy: Anger Cues and Responses  Participation Level:  Active  In this group, patients learned how to recognize the physical, cognitive, emotional, and behavioral responses they have to anger-provoking situations.  They identified a recent time they became angry and how they reacted.  They analyzed how their reaction was possibly beneficial and how it was possibly unhelpful.  The group discussed anger warning signs and how to know when our anger can potentially become a problem. The group will discuss how our thoughts impact our feelings which in result affect our behaviors. Patients will learn thought replacement and explore alternative emotions in addition to feeling anger. Patients will share with the group and learn from CSW but also other patients coping skills that work for others.   Therapeutic Goals: 1. Patients will remember their last incident of anger and how they felt emotionally and physically, what their thoughts were at the time, and how they behaved. 2. Patients will identify how their behavior at that time worked for them, as well as how it worked against them. 3. Patients will explore how their body, mind and feelings play a role with anger. 4. Patients will learn that anger itself is normal and cannot be eliminated, and that healthier reactions can assist with resolving conflict rather than worsening situations. 5. Patients will learn thought replacement and discuss positive ways to cope with anger.   Summary of Patient Progress:  Patient was engaged and participated throughout the group session. The patient shared that his most recent time of anger was at work because his role is new and not super LGBT friendly but his job is important. Patient shared he felt like he was being brushed off and isolated. Patient shared that he really struggles with taking care of everyone else  before himself.    Therapeutic Modalities:   Cognitive Behavioral Therapy  Shellia CleverlyStephanie N Tanajah Boulter, LCSW  10/02/2017 5:04 PM

## 2017-10-02 NOTE — Progress Notes (Signed)
Pt attended group this evening. 

## 2017-10-02 NOTE — BHH Group Notes (Signed)
Identifying Needs   Date:  10/02/2017  Time:  4:16 PM  Type of Therapy:  Nurse Education  /  The group focuses on teachng patients how to identify their needs and then how to develop the healthy skills needed to get those needs met.  Participation Level:  Active  Participation Quality:  Attentive  Affect:  Appropriate  Cognitive:  Appropriate  Insight:  Improving  Engagement in Group:  Engaged  Modes of Intervention:  Education  Summary of Progress/Problems:  Lauralyn Primes 10/02/2017, 4:16 PM

## 2017-10-03 MED ORDER — IBUPROFEN 600 MG PO TABS
600.0000 mg | ORAL_TABLET | Freq: Three times a day (TID) | ORAL | Status: DC | PRN
  Administered 2017-10-03: 600 mg via ORAL
  Filled 2017-10-03: qty 1

## 2017-10-03 NOTE — Plan of Care (Signed)
  Problem: Education: Goal: Mental status will improve Outcome: Progressing   

## 2017-10-03 NOTE — BHH Group Notes (Signed)
LCSW Group Therapy Note  10/03/2017    9:30 -10:30 AM               Type of Therapy and Topic:  Group Therapy: Establishing Boundaries  Participation Level:  Active  In this group, patients learned how to define boundaries, discussed the different types or boundaries with examples.  They identified times that boundaries had been violated and how they reacted.  They analyzed how their reaction was possibly beneficial and how it was possibly unhelpful.  The group discussed how to set boundaries, respect others boundaries and communicate their boundaries. The group utilized a role play scenarios (working in groups) and discussed how each person in the scenario could have reacted differently and what boundaries they need to implement to improve their life. Patients discussed how to establish boundaries with clear consequences. Patients will explore discussion questions that address media influence and why it is hard to set boundaries.   Therapeutic Goals: 1. Patients will define boundaries and explore (physical, personal space and language boundaries)  2. Patients will remember their last incident where their boundaries were violated and how they behaved 3. Patients will practice empathy and understanding of other's boundaries and learn from others in group 4. Patients will explore how they may have crossed another person's boundaries in the past.  5. Patients will learn healthy ways to set and communicate boundaries. 6. Patients will actively engage in group activity utilizing role play   Summary of Patient Progress:  Patient was engaged and participated throughout the group session. Patient engaged in the small group activity and read outloud for the large group. Patient reports understanding that following through with the boundaries set is key. Patient reports when he leaves the hospital he plans to "communicate my boundaries better and to stand up for myself when I tell people outing me is not  okay."  Therapeutic Modalities:   Cognitive Behavioral Therapy  Shellia CleverlyStephanie N Asianna Brundage, LCSW  10/03/2017 11:58 AM

## 2017-10-03 NOTE — Progress Notes (Signed)
Pt attended group this evening. 

## 2017-10-03 NOTE — B[facility] Group Notes (Signed)
Identifying Needs   Date:  10/03/2017  Time:  3:02 PM  Type of Therapy:  Nurse Education  /  The group focuses on teaching patients how to identify their needs and then hwo to develop healtheir coping skills- that will enable them to get some of their needs met.  Participation Level:  Active  Participation Quality:  Attentive  Affect:  Appropriate  Cognitive:  Alert  Insight:  Improving  Engagement in Group:  Engaged  Modes of Intervention:  Education  Summary of Progress/Problems:  Aaron Rangel 10/03/2017, 3:02 PM

## 2017-10-03 NOTE — Progress Notes (Addendum)
[facility] MD Progress Note  10/03/2017 10:21 AM Aaron Rangel  MRN:  [id number]   Subjective: Patient states that he is feeling much better today.  He reports that he slept good last night and he has been eating much better and he is glad to have his appetite back.  He denies any depression or anxiety.  He denies any SI/HI/AVH and contracts for safety.  He reports that he wrote a letter to his ex-wife last night to help him process some of the issue he is has there.  He also reports that his fianc visited last night and they have a good plan for his discharge.  He is hoping to discharge tomorrow as he already has an appointment for his therapist on Tuesday.  He also reports that he is going to wait to restart his testosterone injections on the seventh when he goes to see Dr. Clelia Croft.  Objective: Patient's chart and findings reviewed and discussed with treatment team.  Patient presents in the day room and is attending group.  He has been seen interacting with peers and staff appropriately.  He has been medication compliant and has shown improvement.  Patient wanted to be discharged today but encouraged patient to stay another night to ensure continued improvement as today was the first day of denying any symptoms.  We will continue all medications as prescribed.  Potential discharge tomorrow if he remains stable.   Principal Problem: MDD (major depressive disorder), recurrent severe, without psychosis (HCC) Diagnosis:   Patient Active Problem List   Diagnosis Date Noted  . MDD (major depressive disorder), recurrent severe, without psychosis (HCC) [F33.2] 09/30/2017   Total Time spent with patient: 20 minutes  Past Psychiatric History: See H&P  Past Medical History:  Past Medical History:  Diagnosis Date  . Anxiety   . Depression     Past Surgical History:  Procedure Laterality Date  . BRAIN SURGERY    . BREAST SURGERY    . CARDIAC SURGERY    . MASTECTOMY     double  . OVARIAN CYST REMOVAL      Family History:  Family History  Problem Relation Age of Onset  . Cancer Maternal Grandmother   . Cancer Maternal Grandfather    Family Psychiatric  History: See H&P Social History:  Social History   Substance and Sexual Activity  Alcohol Use No   Comment: weekly     Social History   Substance and Sexual Activity  Drug Use No    Social History   Socioeconomic History  . Marital status: Single    Spouse name: Not on file  . Number of children: Not on file  . Years of education: Not on file  . Highest education level: Not on file  Occupational History  . Not on file  Social Needs  . Financial resource strain: Not on file  . Food insecurity:    Worry: Not on file    Inability: Not on file  . Transportation needs:    Medical: Not on file    Non-medical: Not on file  Tobacco Use  . Smoking status: Former Smoker    Types: E-cigarettes    Last attempt to quit: 09/30/2017  . Smokeless tobacco: Never Used  Substance and Sexual Activity  . Alcohol use: No    Comment: weekly  . Drug use: No  . Sexual activity: Yes  Lifestyle  . Physical activity:    Days per week: Not on file    Minutes  per session: Not on file  . Stress: Not on file  Relationships  . Social connections:    Talks on phone: Not on file    Gets together: Not on file    Attends religious service: Not on file    Active member of club or organization: Not on file    Attends meetings of clubs or organizations: Not on file    Relationship status: Not on file  Other Topics Concern  . Not on file  Social History Narrative  . Not on file   Additional Social History:    Pain Medications: denies Prescriptions: denies Over the Counter: denies History of alcohol / drug use?: Yes Name of Substance 1: marijuana 1 - Frequency: nightly (to sleep) 1 - Duration: ongoing 1 - Last Use / Amount: last night                  Sleep: Good  Appetite:  Good  Current Medications: Current  Facility-Administered Medications  Medication Dose Route Frequency Provider Last Rate Last Dose  . busPIRone (BUSPAR) tablet 5 mg  5 mg Oral BID Rangel, Gerlene Burdock, FNP   5 mg at 10/03/17 1610  . feeding supplement (ENSURE ENLIVE) (ENSURE ENLIVE) liquid 237 mL  237 mL Oral BID BM Cobos, Rockey Situ, MD   237 mL at 10/02/17 1442  . hydrOXYzine (ATARAX/VISTARIL) tablet 25 mg  25 mg Oral TID PRN Rangel, Gerlene Burdock, FNP   25 mg at 10/02/17 2128  . mirtazapine (REMERON) tablet 15 mg  15 mg Oral QHS Cobos, Rockey Situ, MD   15 mg at 10/02/17 2128  . multivitamin with minerals tablet 1 tablet  1 tablet Oral Daily Cobos, Rockey Situ, MD   1 tablet at 10/03/17 9604  . sertraline (ZOLOFT) tablet 100 mg  100 mg Oral Daily Rangel, Gerlene Burdock, FNP   100 mg at 10/03/17 5409    Lab Results:  No results found for this or any previous visit (from the past 48 hour(s)).  Blood Alcohol level:  Lab Results  Component Value Date   ETH <10 09/30/2017    Metabolic Disorder Labs: Lab Results  Component Value Date   HGBA1C 5.0 09/30/2017   MPG 96.8 09/30/2017   No results found for: PROLACTIN Lab Results  Component Value Date   CHOL 159 09/30/2017   TRIG 83 09/30/2017   HDL 62 09/30/2017   CHOLHDL 2.6 09/30/2017   VLDL 17 09/30/2017   LDLCALC 80 09/30/2017    Physical Findings: AIMS: Facial and Oral Movements Muscles of Facial Expression: None, normal Lips and Perioral Area: None, normal Jaw: None, normal Tongue: None, normal,Extremity Movements Upper (arms, wrists, hands, fingers): None, normal Lower (legs, knees, ankles, toes): None, normal, Trunk Movements Neck, shoulders, hips: None, normal, Overall Severity Severity of abnormal movements (highest score from questions above): None, normal Incapacitation due to abnormal movements: None, normal Patient's awareness of abnormal movements (rate only patient's report): No Awareness, Dental Status Current problems with teeth and/or dentures?: No Does patient  usually wear dentures?: No  CIWA:    COWS:     Musculoskeletal: Strength & Muscle Tone: within normal limits Gait & Station: normal Patient leans: N/A  Psychiatric Specialty Exam: Physical Exam  Nursing note and vitals reviewed. Constitutional: He is oriented to person, place, and time. He appears well-developed and well-nourished.  Cardiovascular: Normal rate.  Respiratory: Effort normal.  Musculoskeletal: Normal range of motion.  Neurological: He is alert and oriented to person, place, and time.  Skin: Skin is warm.    Review of Systems  Constitutional: Negative.   HENT: Negative.   Eyes: Negative.   Respiratory: Negative.   Cardiovascular: Negative.   Gastrointestinal: Positive for diarrhea.  Genitourinary: Negative.   Musculoskeletal: Negative.   Skin: Negative.   Neurological: Negative.   Endo/Heme/Allergies: Negative.   Psychiatric/Behavioral: Negative.     Blood pressure 119/74, pulse 66, temperature 97.8 F (36.6 C), temperature source Oral, resp. rate 18, height 5\' 6"  (1.676 m), weight 50.3 kg (111 lb), SpO2 (!) 89 %.Body mass index is 17.92 kg/m.  General Appearance: Casual  Eye Contact:  Good  Speech:  Clear and Coherent and Normal Rate  Volume:  Normal  Mood:  Euthymic  Affect:  Congruent  Thought Process:  Goal Directed and Descriptions of Associations: Intact  Orientation:  Full (Time, Place, and Person)  Thought Content:  WDL  Suicidal Thoughts:  No  Homicidal Thoughts:  No  Memory:  Immediate;   Good Recent;   Good Remote;   Good  Judgement:  Fair  Insight:  Good  Psychomotor Activity:  Normal  Concentration:  Concentration: Good and Attention Span: Good  Recall:  Good  Fund of Knowledge:  Good  Language:  Good  Akathisia:  No  Handed:  Right  AIMS (if indicated):     Assets:  Communication Skills Desire for Improvement Financial Resources/Insurance Housing Physical Health Social Support Transportation  ADL's:  Intact  Cognition:   WNL  Sleep:  Number of Hours: 6.75   Problems addressed MDD severe recurrent  Treatment Plan Summary: Daily contact with patient to assess and evaluate symptoms and progress in treatment, Medication management and Plan is to: -Continue BuSpar 5 mg p.o. twice daily for anxiety -Continue Vistaril 25 mg p.o. 3 times daily as needed for anxiety -Continue Remeron 15 mg p.o. nightly for mood stability and anxiety -Continue Zoloft 100 mg p.o. daily for mood stability -Encourage group therapy participation  Aaron Bunnellravis B Money, FNP 10/03/2017, 10:21 AM   ..Marland Kitchen.Agree with NP Progress Note

## 2017-10-03 NOTE — Progress Notes (Signed)
Patient ID: Aaron Rangel, adult   DOB: Dec 06, 1990, 27 y.o.   MRN: [id number] DAR Note: Pt observed in the dayroom flat and withdrawn to self. Pt at the time of assessment denied all, "I thought I was going home today." Pt contracts for safety. All patient's questions and concerns addressed. Support, encouragement, and safe environment provided. Will continue to monitor for any changes. Pt was med compliant. Pt attended wrap-up group. Safety checks continue.

## 2017-10-03 NOTE — Progress Notes (Signed)
Adult Psychoeducational Group Note  Date:  10/03/2017 Time:  9:54 AM  Group Topic/Focus:  Goals Group:   The focus of this group is to help patients establish daily goals to achieve during treatment and discuss how the patient can incorporate goal setting into their daily lives to aide in recovery.  Participation Level:  Active  Participation Quality:  Appropriate  Affect:  Excited  Cognitive:  Appropriate  Insight: Good  Engagement in Group:  Engaged  Modes of Intervention:  Discussion  Additional Comments:  Pt goal for today is to go home and start work.  Graceann CongressWollie, Tajuan Dufault Celcia 10/03/2017, 9:54 AM

## 2017-10-03 NOTE — Progress Notes (Signed)
D patient is observed OOB UAL on the 400 hall tolerated well. He endorses less flatness and less depression in his affect this am. HE makes direct eye contact with this writer when he interacts to get his am medications.     A He completed his daily assessment and on this he wrote he denied SI today and he rated his depression, hopelessness and anxiety " 1/0/0/", respectively. He says his goal for the day is to work on his discharge plan.Counseling given: Yes  solidify his plans and to identify what he needs to focus on .    R Safety is in place and poc cont.

## 2017-10-04 MED ORDER — MIRTAZAPINE 15 MG PO TABS: 15.0000 mg | ORAL_TABLET | Freq: Every day | ORAL | 0 refills | Status: DC

## 2017-10-04 MED ORDER — HYDROXYZINE HCL 25 MG PO TABS: 25.0000 mg | ORAL_TABLET | Freq: Three times a day (TID) | ORAL | 0 refills | Status: DC | PRN

## 2017-10-04 MED ORDER — BUSPIRONE HCL 5 MG PO TABS: 5.0000 mg | ORAL_TABLET | Freq: Two times a day (BID) | ORAL | 0 refills | Status: DC

## 2017-10-04 MED ORDER — SERTRALINE HCL 100 MG PO TABS: 100.0000 mg | ORAL_TABLET | Freq: Every day | ORAL | 0 refills | Status: DC

## 2017-10-04 NOTE — Progress Notes (Signed)
Pt discharged with the family member. Pt was ambulatory, stable and appreciative at that time. All papers, medication sample, and  prescriptions were given and valuables returned. Verbal understanding expressed. Denies SI/HI and A/VH. Pt given opportunity to express concerns and ask questions.

## 2017-10-04 NOTE — Progress Notes (Signed)
  [facility] :  Will you be returning to the same living situation after discharge:  Yes,  own home with girlfriend At discharge, do you have transportation home?: Yes,  girlfriend Do you have the ability to pay for your medications: Yes,  [employer]  Release of information consent forms completed and in the chart;  Patient's signature needed at discharge.  Patient to Follow up at: Follow-up Information    [facility] Go on 10/05/2017.   Why:  Please attend your therapy appt with Dion SaucierShayna Cole on Tuesday, 10/05/17, at 10:00am. Contact information: [address] [place] [address] [phone]        [facility]. Go on 10/06/2017.   Why:  Please attend your medication appt with Dr Jackquline BerlinIzediuno, MD on Wed, 10/06/17, at 10:00am. Contact information: [address] [place], [place] [address] P: [phone] F: [phone]          Next level of care provider has access to [facility] Link:no  Safety Planning and Suicide Prevention discussed: No. Attempts made.  SPE completed with pt.     Has patient been referred to the Quitline?: Patient refused referral  Patient has been referred for addiction treatment: Pt. refused referral  Lorri FrederickWierda, Emeline Simpson Jon, LCSW 10/04/2017, 9:49 AM

## 2017-10-04 NOTE — [facility] Suicide Risk Assessment (Addendum)
[facility] INPATIENT:  Family/Significant Other Suicide Prevention Education  Suicide Prevention Education:  Contact Attempts: Madelaine BhatChloe Vigrass, girlfriend, [phone], has been identified by the patient as the family member/significant other with whom the patient will be residing, and identified as the person(s) who will aid the patient in the event of a mental health crisis.  With written consent from the patient, two attempts were made to provide suicide prevention education, prior to and/or following the patient's discharge.  We were unsuccessful in providing suicide prevention education.  A suicide education pamphlet was given to the patient to share with family/significant other.  Date and time of first attempt:10/04/17, 0830 Date and time of second attempt:10/04/17, 0948  Lorri FrederickWierda, Lakaya Tolen Jon, LCSW 10/04/2017, 8:31 AM

## 2017-10-04 NOTE — [facility] Suicide Risk Assessment (Signed)
[facility]   Principal Problem: MDD (major depressive disorder), recurrent severe, without psychosis (HCC) Discharge Diagnoses:  Patient Active Problem List   Diagnosis Date Noted  . MDD (major depressive disorder), recurrent severe, without psychosis (HCC) [F33.2] 09/30/2017    Total Time spent with patient: 30 minutes  Musculoskeletal: Strength & Muscle Tone: within normal limits Gait & Station: normal Patient leans: N/A  Psychiatric Specialty Exam: ROS  No headache, no chest pain, no shortness of breath, no nausea, no vomiting , no fever , no chills   Blood pressure 119/87, pulse 78, temperature 98.4 F (36.9 C), temperature source Oral, resp. rate 20, height 5\' 6"  (1.676 m), weight 50.3 kg (111 lb), SpO2 (!) 89 %.Body mass index is 17.92 kg/m.  General Appearance: Well Groomed  Eye Contact::  Good  Speech:  Normal Rate409  Volume:  Normal  Mood:  much improved, euthymic   Affect:  Appropriate and Full Range  Thought Process:  Linear and Descriptions of Associations: Intact  Orientation:  Full (Time, Place, and Person)  Thought Content:  no hallucinations,no delusions, not internally preoccupied   Suicidal Thoughts:  No denies suicidal or self injurious ideations   Homicidal Thoughts:  No denies any homicidal or violent ideations   Memory:  recent and remote grossly intact   Judgement:  Other:  improved   Insight:  improved   Psychomotor Activity:  Normal  Concentration:  Good  Recall:  Good  Fund of Knowledge:Good  Language: Good  Akathisia:  Negative  Handed:  Right  AIMS (if indicated):     Assets:  Communication Skills Resilience  Sleep:  Number of Hours: 6.75  Cognition: WNL  ADL's:  Intact   Mental Status Per Nursing Assessment::   On Admission:  Suicidal ideation indicated by patient, Self-harm thoughts, Suicide plan  Demographic Factors:  27 year old transgender male, lives with GF, employed   Loss Factors: Job related  stress, divorce   Historical Factors: One prior psychiatric admission for depression, no history of suicide attempts, history of self cutting , history of panic attacks   Risk Reduction Factors:   Sense of responsibility to family, Employed, Living with another person, especially a relative, Positive social support and Positive coping skills or problem solving skills  Continued Clinical Symptoms:  Alert and attentive, well related, pleasant, mood improved and currently euthymic, affect appropriate and full in range , no thought disorder, no suicidal or self injurious ideations, no homicidal or violent ideations, no hallucinations, no delusions, future oriented. Denies medication side effects. Behavior on unit in good control, pleasant , calm.   Cognitive Features That Contribute To Risk:  No gross cognitive deficits noted upon discharge. Is alert , attentive, and oriented x 3   Suicide Risk:  Mild:  Suicidal ideation of limited frequency, intensity, duration, and specificity.  There are no identifiable plans, no associated intent, mild dysphoria and related symptoms, good self-control (both objective and subjective assessment), few other risk factors, and identifiable protective factors, including available and accessible social support.    Plan Of Care/Follow-up recommendations:  Activity:  as tolerated  Diet:  regular Tests:  NA Other:  see below  Patient is leaving unit in good spirits Plans to return home Plans to follow up with his therapist, Nathen MayShana Cole, and with his PCP , Dr. Norberto SorensonEva Shaw.    Craige CottaFernando A Cobos, MD 10/04/2017, 7:47 AM

## 2017-10-04 NOTE — Discharge Summary (Addendum)
Physician Discharge Summary Note  Patient:  Aaron Rangel is an 27 y.o., adult MRN:  [id number] DOB:  October 16, 1990 Patient phone:  [phone] (home)  Patient address:   [address] [place] [address],  Total Time spent with patient: 20 minutes  Date of Admission:  09/30/2017 Date of Discharge: 10/04/17  Reason for Admission:  Worsening depression with SI  Principal Problem: MDD (major depressive disorder), recurrent severe, without psychosis [facility]) Discharge Diagnoses: Patient Active Problem List   Diagnosis Date Noted  . MDD (major depressive disorder), recurrent severe, without psychosis (HCC) [F33.2] 09/30/2017    Past Psychiatric History: one prior psychiatric admission in [place] ( 2016) for depression. States he has never attempted suicide, history of self cutting , which he states has been " on and off ", more frequently when facing more stress, denies history of psychosis, denies history of mania, reports history of Panic Attacks and Agoraphobia.Denies history of violence . States he had been doing well on psychiatric medications , but stopped several months ago, not due to side effects but because of cost/affordability    Past Medical History:  Past Medical History:  Diagnosis Date  . Anxiety   . Depression     Past Surgical History:  Procedure Laterality Date  . BRAIN SURGERY    . BREAST SURGERY    . CARDIAC SURGERY    . MASTECTOMY     double  . OVARIAN CYST REMOVAL     Family History:  Family History  Problem Relation Age of Onset  . Cancer Maternal Grandmother   . Cancer Maternal Grandfather    Family Psychiatric  History: reports there is a history of depression and anxiety in family members, no suicides in family, mother had history of opiate use disorder, history of alcohol use disorder in extended family   Social History:  Social History   Substance and Sexual Activity  Alcohol Use No   Comment: weekly     Social History   Substance  and Sexual Activity  Drug Use No    Social History   Socioeconomic History  . Marital status: Single    Spouse name: Not on file  . Number of children: Not on file  . Years of education: Not on file  . Highest education level: Not on file  Occupational History  . Not on file  Social Needs  . Financial resource strain: Not on file  . Food insecurity:    Worry: Not on file    Inability: Not on file  . Transportation needs:    Medical: Not on file    Non-medical: Not on file  Tobacco Use  . Smoking status: Former Smoker    Types: E-cigarettes    Last attempt to quit: 09/30/2017    Years since quitting: 0.0  . Smokeless tobacco: Never Used  Substance and Sexual Activity  . Alcohol use: No    Comment: weekly  . Drug use: No  . Sexual activity: Yes  Lifestyle  . Physical activity:    Days per week: Not on file    Minutes per session: Not on file  . Stress: Not on file  Relationships  . Social connections:    Talks on phone: Not on file    Gets together: Not on file    Attends religious service: Not on file    Active member of club or organization: Not on file    Attends meetings of clubs or organizations: Not on file  Relationship status: Not on file  Other Topics Concern  . Not on file  Social History Narrative  . Not on file    Hospital Course:   09/30/17 [facility] MD Assessment: Patient is 27 year old transgender male, presented to hospital voluntarily in company of GF. Reports worsening depression over recent months, and describes recent suicidal ideations, with thoughts of hanging self or of shooting self,although states he does not have access to a gun. Endorses neuro-vegetative symptoms as below. Denies history of psychosis. Attributes depression in part to separation/ currently going through actual divorce process, recent wedding anniversary, job related stress, and being off his psychiatric medications x several months  Patient remained on the [facility] unit for 4 days.  The patient stabilized on medication and therapy. Patient was discharged on Buspar 5 mg BID, Vistaril 25 mg TID PRN, Remeron 15 mg QHS, and Zoloft 100 mg Daily. Patient has shown improvement with improved mood, affect, sleep, appetite, and interaction. Patient has attended group and participated. Patient has been seen in the day room interacting with peers and staff appropriately. Patient denies any SI/HI/AVH and contracts for safety. Patient agrees to follow up at [facility] and has PCP follow up with Dr. Clelia Croft on 10-08-17. Patient is provided with prescriptions for their medications upon discharge.    Physical Findings: AIMS: Facial and Oral Movements Muscles of Facial Expression: None, normal Lips and Perioral Area: None, normal Jaw: None, normal Tongue: None, normal,Extremity Movements Upper (arms, wrists, hands, fingers): None, normal Lower (legs, knees, ankles, toes): None, normal, Trunk Movements Neck, shoulders, hips: None, normal, Overall Severity Severity of abnormal movements (highest score from questions above): None, normal Incapacitation due to abnormal movements: None, normal Patient's awareness of abnormal movements (rate only patient's report): No Awareness, Dental Status Current problems with teeth and/or dentures?: No Does patient usually wear dentures?: No  CIWA:    COWS:     Musculoskeletal: Strength & Muscle Tone: within normal limits Gait & Station: normal Patient leans: N/A  Psychiatric Specialty Exam: Physical Exam  Nursing note and vitals reviewed. Constitutional: He is oriented to person, place, and time. He appears well-developed and well-nourished.  Cardiovascular: Normal rate.  Respiratory: Effort normal.  Musculoskeletal: Normal range of motion.  Neurological: He is alert and oriented to person, place, and time.  Skin: Skin is warm.    Review of Systems  Constitutional: Negative.   HENT: Negative.   Eyes: Negative.   Respiratory:  Negative.   Cardiovascular: Negative.   Gastrointestinal: Negative.   Genitourinary: Negative.   Musculoskeletal: Negative.   Skin: Negative.   Neurological: Negative.   Endo/Heme/Allergies: Negative.   Psychiatric/Behavioral: Negative.     Blood pressure 119/87, pulse 78, temperature 98.4 F (36.9 C), temperature source Oral, resp. rate 20, height 5\' 6"  (1.676 m), weight 50.3 kg (111 lb), SpO2 (!) 89 %.Body mass index is 17.92 kg/m.  General Appearance: Casual  Eye Contact:  Good  Speech:  Clear and Coherent and Normal Rate  Volume:  Normal  Mood:  Euthymic  Affect:  Congruent  Thought Process:  Goal Directed and Descriptions of Associations: Intact  Orientation:  Full (Time, Place, and Person)  Thought Content:  WDL  Suicidal Thoughts:  No  Homicidal Thoughts:  No  Memory:  Immediate;   Good Recent;   Good Remote;   Good  Judgement:  Fair  Insight:  Fair  Psychomotor Activity:  Normal  Concentration:  Concentration: Good and Attention Span: Good  Recall:  Good  Fund of Knowledge:  Good  Language:  Good  Akathisia:  No  Handed:  Right  AIMS (if indicated):     Assets:  Communication Skills Desire for Improvement Financial Resources/Insurance Housing Physical Health Social Support Transportation  ADL's:  Intact  Cognition:  WNL  Sleep:  Number of Hours: 6.75        Has this patient used any form of tobacco in the last 30 days? (Cigarettes, Smokeless Tobacco, Cigars, and/or Pipes) Yes, No  Blood Alcohol level:  Lab Results  Component Value Date   ETH <10 09/30/2017    Metabolic Disorder Labs:  Lab Results  Component Value Date   HGBA1C 5.0 09/30/2017   MPG 96.8 09/30/2017   No results found for: PROLACTIN Lab Results  Component Value Date   CHOL 159 09/30/2017   TRIG 83 09/30/2017   HDL 62 09/30/2017   CHOLHDL 2.6 09/30/2017   VLDL 17 09/30/2017   LDLCALC 80 09/30/2017    See Psychiatric Specialty Exam and Suicide Risk Assessment completed by  Attending Physician prior to discharge.  Discharge destination:  Home  Is patient on multiple antipsychotic therapies at discharge:  No   Has Patient had three or more failed trials of antipsychotic monotherapy by history:  No  Recommended Plan for Multiple Antipsychotic Therapies: NA   Allergies as of 10/04/2017      Reactions   Morphine And Related Itching   Oxycodone Itching, Other (See Comments)   hot   Sulfa Antibiotics Itching, Rash      Medication List    STOP taking these medications   azithromycin 250 MG tablet Commonly known as:  ZITHROMAX     TAKE these medications     Indication  busPIRone 5 MG tablet Commonly known as:  BUSPAR Take 1 tablet (5 mg total) by mouth 2 (two) times daily. For anxiety  Indication:  Anxiety Disorder   hydrOXYzine 25 MG tablet Commonly known as:  ATARAX/VISTARIL Take 1 tablet (25 mg total) by mouth 3 (three) times daily as needed for anxiety.  Indication:  Feeling Anxious   mirtazapine 15 MG tablet Commonly known as:  REMERON Take 1 tablet (15 mg total) by mouth at bedtime. For mood control  Indication:  mood stability   sertraline 100 MG tablet Commonly known as:  ZOLOFT Take 1 tablet (100 mg total) by mouth daily. For mood control  Indication:  mood stability      Follow-up Information    [facility] Go on 10/05/2017.   Why:  Please attend your therapy appt with Dion Saucier on Tuesday, 10/05/17, at 10:00am. Contact information: [address] [place] [address] [phone]           Follow-up recommendations:  Continue activity as tolerated. Continue diet as recommended by your PCP. Ensure to keep all appointments with outpatient providers.  Comments:  Patient is instructed prior to discharge to: Take all medications as prescribed by his/her mental healthcare provider. Report any adverse effects and or reactions from the medicines to his/her outpatient provider promptly. Patient has been  instructed & cautioned: To not engage in alcohol and or illegal drug use while on prescription medicines. In the event of worsening symptoms, patient is instructed to call the crisis hotline, 911 and or go to the nearest ED for appropriate evaluation and treatment of symptoms. To follow-up with his/her primary care provider for your other medical issues, concerns and or health care needs.    Signed: Gerlene Burdock Money,  FNP 10/04/2017, 8:14 AM   Patient seen, Suicide Assessment Completed.  Disposition Plan Reviewed

## 2017-10-04 NOTE — Progress Notes (Signed)
Recreation Therapy Notes  Date: 6.3.19 Time: 0930 Location: [address] Dayroom  Group Topic: Stress Management  Goal Area(s) Addresses:  Patient will verbalize importance of using healthy stress management.  Patient will identify positive emotions associated with healthy stress management.   Intervention: Stress Management  Activity :  Body Scan Meditation.  LRT introduced the stress management technique of meditation.  LRT played a meditation that focused on scanning the body for any feelings or sensations they may be feeling.  Patients were to follow along as meditation played.  Education:  Stress Management, Discharge Planning.   Education Outcome: Acknowledges edcuation/In group clarification offered/Needs additional education  Clinical Observations/Feedback: Pt did not attend group.    Caroll RancherMarjette Tashyra Adduci, LRT/CTRS         Caroll RancherLindsay, Mirza Fessel A 10/04/2017 11:36 AM

## 2017-10-04 NOTE — Progress Notes (Signed)
Patient ID: Aaron Rangel Aaron Rangel, adult   DOB: 1990/07/25, 27 y.o.   MRN: [id number] D: Patient in dayroom on approach. Pt reports he is doing well and looking forward to discharge tomorrow. Pt reports he is tolerating medication well. Pt reports decreased anxiety. Pt thought process is organized and behavior is appropriate. Pt denies SI/HI/AVH and pain. Pt attended and participated in evening wrap up group. Cooperative with assessment.   A: Medications administered as prescribed. Support and encouragement provided as needed. Pt encouraged to discuss feelings and come to staff with any question or concerns.   R: Patient remains safe and complaint with medications.

## 2017-10-08 ENCOUNTER — Other Ambulatory Visit: Payer: Self-pay

## 2017-10-08 ENCOUNTER — Ambulatory Visit (INDEPENDENT_AMBULATORY_CARE_PROVIDER_SITE_OTHER): Payer: 59 | Admitting: Family Medicine

## 2017-10-08 ENCOUNTER — Encounter: Payer: Self-pay | Admitting: Family Medicine

## 2017-10-08 VITALS — BP 118/72 | HR 62 | Temp 98.0°F | Resp 16 | Ht 66.54 in | Wt 111.0 lb

## 2017-10-08 DIAGNOSIS — Z789 Other specified health status: Secondary | ICD-10-CM

## 2017-10-08 DIAGNOSIS — F640 Transsexualism: Secondary | ICD-10-CM | POA: Diagnosis not present

## 2017-10-08 DIAGNOSIS — Z79899 Other long term (current) drug therapy: Secondary | ICD-10-CM | POA: Diagnosis not present

## 2017-10-08 DIAGNOSIS — Z79890 Hormone replacement therapy (postmenopausal): Secondary | ICD-10-CM

## 2017-10-08 DIAGNOSIS — R079 Chest pain, unspecified: Secondary | ICD-10-CM

## 2017-10-08 DIAGNOSIS — J3089 Other allergic rhinitis: Secondary | ICD-10-CM

## 2017-10-08 DIAGNOSIS — F332 Major depressive disorder, recurrent severe without psychotic features: Secondary | ICD-10-CM

## 2017-10-08 MED ORDER — "NEEDLE (DISP) 23G X 1"" MISC": 1.0000 [IU] | 1 refills | Status: AC

## 2017-10-08 MED ORDER — SYRINGE (DISPOSABLE) 1 ML MISC: 1.0000 [IU] | 1 refills | Status: DC

## 2017-10-08 MED ORDER — TESTOSTERONE CYPIONATE 200 MG/ML IM SOLN: 60.0000 mg | INTRAMUSCULAR | 0 refills | Status: DC

## 2017-10-08 MED ORDER — OMEPRAZOLE 40 MG PO CPDR: 40.0000 mg | DELAYED_RELEASE_CAPSULE | Freq: Every day | ORAL | 1 refills | Status: DC

## 2017-10-08 MED ORDER — CETIRIZINE-PSEUDOEPHEDRINE ER 5-120 MG PO TB12: 1.0000 | ORAL_TABLET | ORAL | Status: AC

## 2017-10-08 MED ORDER — MONTELUKAST SODIUM 10 MG PO TABS: 10.0000 mg | ORAL_TABLET | Freq: Every day | ORAL | 3 refills | Status: DC

## 2017-10-08 MED ORDER — "NEEDLE (DISP) 18G X 1-1/2"" MISC": 1.0000 [IU] | 1 refills | Status: DC

## 2017-10-08 NOTE — Patient Instructions (Addendum)
I recommend coming in to have your blood drawn in a lab-only visit ~3-6 days prior to our next scheduled office visit so that the results are available for us to review together and discuss any implications during your visit.  Try to time this to be drawn ~3-4 d after your last injection.     IF you received an x-ray today, you will receive an invoice from [facility]. Please contact [facility] at [phone] with questions or concerns regarding your invoice.   IF you received labwork today, you will receive an invoice from [facility]. Please contact [facility] at [phone] with questions or concerns regarding your invoice.   Our billing staff will not be able to assist you with questions regarding bills from these companies.  You will be contacted with the lab results as soon as they are available. The fastest way to get your results is to activate your My Chart account. Instructions are located on the last page of this paperwork. If you have not heard from us regarding the results in 2 weeks, please contact this office.     Gastroesophageal Reflux Disease, Adult Normally, food travels down the esophagus and stays in the stomach to be digested. However, when a person has gastroesophageal reflux disease (GERD), food and stomach acid move back up into the esophagus. When this happens, the esophagus becomes sore and inflamed. Over time, GERD can create small holes (ulcers) in the lining of the esophagus. What are the causes? This condition is caused by a problem with the muscle between the esophagus and the stomach (lower esophageal sphincter, or LES). Normally, the LES muscle closes after food passes through the esophagus to the stomach. When the LES is weakened or abnormal, it does not close properly, and that allows food and stomach acid to go back up into the esophagus. The LES can be weakened by certain dietary substances, medicines, and medical conditions,  including:  Tobacco use.  Pregnancy.  Having a hiatal hernia.  Heavy alcohol use.  Certain foods and beverages, such as coffee, chocolate, onions, and peppermint.  What increases the risk? This condition is more likely to develop in:  People who have an increased body weight.  People who have connective tissue disorders.  People who use NSAID medicines.  What are the signs or symptoms? Symptoms of this condition include:  Heartburn.  Difficult or painful swallowing.  The feeling of having a lump in the throat.  Abitter taste in the mouth.  Bad breath.  Having a large amount of saliva.  Having an upset or bloated stomach.  Belching.  Chest pain.  Shortness of breath or wheezing.  Ongoing (chronic) cough or a night-time cough.  Wearing away of tooth enamel.  Weight loss.  Different conditions can cause chest pain. Make sure to see your health care provider if you experience chest pain. How is this diagnosed? Your health care provider will take a medical history and perform a physical exam. To determine if you have mild or severe GERD, your health care provider may also monitor how you respond to treatment. You may also have other tests, including:  An endoscopy toexamine your stomach and esophagus with a small camera.  A test thatmeasures the acidity level in your esophagus.  A test thatmeasures how much pressure is on your esophagus.  A barium swallow or modified barium swallow to show the shape, size, and functioning of your esophagus.  How is this treated? The goal of treatment is to help relieve  your symptoms and to prevent complications. Treatment for this condition may vary depending on how severe your symptoms are. Your health care provider may recommend:  Changes to your diet.  Medicine.  Surgery.  Follow these instructions at home: Diet  Follow a diet as recommended by your health care provider. This may involve avoiding foods and  drinks such as: ? Coffee and tea (with or without caffeine). ? Drinks that containalcohol. ? Energy drinks and sports drinks. ? Carbonated drinks or sodas. ? Chocolate and cocoa. ? Peppermint and mint flavorings. ? Garlic and onions. ? Horseradish. ? Spicy and acidic foods, including peppers, chili powder, curry powder, vinegar, hot sauces, and barbecue sauce. ? Citrus fruit juices and citrus fruits, such as oranges, lemons, and limes. ? Tomato-based foods, such as red sauce, chili, salsa, and pizza with red sauce. ? Fried and fatty foods, such as donuts, french fries, potato chips, and high-fat dressings. ? High-fat meats, such as hot dogs and fatty cuts of red and white meats, such as rib eye steak, sausage, ham, and bacon. ? High-fat dairy items, such as whole milk, butter, and cream cheese.  Eat small, frequent meals instead of large meals.  Avoid drinking large amounts of liquid with your meals.  Avoid eating meals during the 2-3 hours before bedtime.  Avoid lying down right after you eat.  Do not exercise right after you eat. General instructions  Pay attention to any changes in your symptoms.  Take over-the-counter and prescription medicines only as told by your health care provider. Do not take aspirin, ibuprofen, or other NSAIDs unless your health care provider told you to do so.  Do not use any tobacco products, including cigarettes, chewing tobacco, and e-cigarettes. If you need help quitting, ask your health care provider.  Wear loose-fitting clothing. Do not wear anything tight around your waist that causes pressure on your abdomen.  Raise (elevate) the head of your bed 6 inches (15cm).  Try to reduce your stress, such as with yoga or meditation. If you need help reducing stress, ask your health care provider.  If you are overweight, reduce your weight to an amount that is healthy for you. Ask your health care provider for guidance about a safe weight loss  goal.  Keep all follow-up visits as told by your health care provider. This is important. Contact a health care provider if:  You have new symptoms.  You have unexplained weight loss.  You have difficulty swallowing, or it hurts to swallow.  You have wheezing or a persistent cough.  Your symptoms do not improve with treatment.  You have a hoarse voice. Get help right away if:  You have pain in your arms, neck, jaw, teeth, or back.  You feel sweaty, dizzy, or light-headed.  You have chest pain or shortness of breath.  You vomit and your vomit looks like blood or coffee grounds.  You faint.  Your stool is bloody or black.  You cannot swallow, drink, or eat. This information is not intended to replace advice given to you by your health care provider. Make sure you discuss any questions you have with your health care provider. Document Released: 01/28/2005 Document Revised: 09/18/2015 Document Reviewed: 08/15/2014 Elsevier Interactive Patient Education  [employer].

## 2017-10-08 NOTE — Progress Notes (Signed)
Subjective:  By signing my name below, I, Aaron Rangel, attest that this documentation has been prepared under the direction and in the presence of Aaron SorensonEva Rosalina Dingwall, MD Electronically Signed: Charline BillsEssence Rangel, ED Scribe 10/08/2017 at 10:34 AM.   Patient ID: Aaron Rangel T Sharp, adult    DOB: [birth year], 27 y.o.   MRN: [id number]  Chief Complaint  Patient presents with  . Establish Care   HPI Aaron Rangel is a 27 y.o. adult who presents to [facility] at [facility] to establish care.   Gender-affirming hormonal therapy - MTF Pt was prev followed by Dr. Ruby ColaPittaway, then transferred to Dr. Manson PasseyBrown in [place]. However, was rarely able to make it to appointments due to distance of travel and therefore had a lot of trouble staying on medication Pt has been on hormone therapy with testosterone injections since 12/23/2011. Been on the same dose for a while but has not had injections for 9-10 months due to depression, finances and work. Does report decreased appetite and ~20 lb weight loss as well. Pt is followed by psychiatrist Dr. Maggie SchwalbeIzzy ([name]). [place] and hysterectomy with oophorectomy in 2015 [facility].  Chest Pain Pt reports occasional sharp, stabbing L-sided cp. States cp lasts for a few seconds-minute prior to self-resolving. Reports some chest wall tenderness with cp and acid reflux which he treats with Pepto a few days/wk. Reports intermittent diarrhea that is increased with stress.  Allergies Pt reports rhinorrhea and environmental allergies that sees to last throughout the yr. He is currently taking Zyrtec D bid. Has also tried saline nasal spray. Has tried Nasacort in the past. Pt has never tried Singulair.  Reports h/o elevated RBCs in the past.  Past Medical History:  Diagnosis Date  . Allergy   . Anxiety   . Depression    Current Outpatient Medications on File Prior to Visit  Medication Sig Dispense Refill  . busPIRone (BUSPAR) 5 MG tablet Take 1 tablet (5  mg total) by mouth 2 (two) times daily. For anxiety 60 tablet 0  . hydrOXYzine (ATARAX/VISTARIL) 25 MG tablet Take 1 tablet (25 mg total) by mouth 3 (three) times daily as needed for anxiety. 30 tablet 0  . mirtazapine (REMERON) 15 MG tablet Take 1 tablet (15 mg total) by mouth at bedtime. For mood control 30 tablet 0  . sertraline (ZOLOFT) 100 MG tablet Take 1 tablet (100 mg total) by mouth daily. For mood control 30 tablet 0   No current facility-administered medications on file prior to visit.    Allergies  Allergen Reactions  . Morphine And Related Itching  . Oxycodone Itching and Other (See Comments)    hot  . Sulfa Antibiotics Itching and Rash   Review of Systems  Constitutional: Positive for appetite change.  HENT: Positive for rhinorrhea.   Cardiovascular: Positive for chest pain (occasional).  Gastrointestinal: Positive for diarrhea (intermittent).  Allergic/Immunologic: Positive for environmental allergies.      Objective:   Physical Exam  Constitutional: He is oriented to person, place, and time. He appears well-developed and well-nourished. No distress.  HENT:  Head: Normocephalic and atraumatic.  Eyes: Conjunctivae and EOM are normal.  Neck: Neck supple. No tracheal deviation present.  Cardiovascular: Normal rate and regular rhythm.  Pulmonary/Chest: Effort normal and breath sounds normal. No respiratory distress.  Musculoskeletal: Normal range of motion.  Neurological: He is alert and oriented to person, place, and time.  Skin: Skin is warm and dry.  Psychiatric:  He has a normal mood and affect. His behavior is normal.  Nursing note and vitals reviewed.  BP 118/72   Pulse 62   Temp 98 F (36.7 C) (Oral)   Resp 16   Ht 5' 6.54" (1.69 m)   Wt 111 lb (50.3 kg)   SpO2 98%   BMI 17.63 kg/m     Assessment & Plan:  Enter future lab orders.  1. Environmental and seasonal allergies - on zyrtec-D qod and nasal saline spray, start singulair  2. 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 Long-term current use of testosterone cypionate   4. Encounter for long-term current use of high risk medication - had full labs last wk on admission to psych hosp from ER - recheck labs in 2-3 mos sev d prior to f/u if possible.  5. Chest pain, unspecified type - very atypical - suspect gerd/esophageal irritation to start therapeutic trial of ppi - if worsens, RTC  6. MDD (major depressive disorder), recurrent severe, without psychosis (HCC) - has appt w/ psych sched and doing much better since getting restarted on meds during hosp and thinking that restarting testosterone will help immensely    Meds ordered this encounter  Medications  . testosterone cypionate (DEPO-TESTOSTERONE) 200 MG/ML injection    Sig: Inject 0.3 mLs (60 mg total) into the muscle once a week.    Dispense:  4 mL    Refill:  0  . omeprazole (PRILOSEC) 40 MG capsule    Sig: Take 1 capsule (40 mg total) by mouth daily. 30 minutes before a meal    Dispense:  30 capsule    Refill:  1  . montelukast (SINGULAIR) 10 MG tablet    Sig: Take 1 tablet (10 mg total) by mouth at bedtime.    Dispense:  30 tablet    Refill:  3  . cetirizine-pseudoephedrine (ZYRTEC-D) 5-120 MG tablet    Sig: Take 1 tablet by mouth every morning.  . Syringe, Disposable, 1 ML MISC    Sig: 1 Units by Does not apply route once a week.    Dispense:  60 each    Refill:  1  . NEEDLE, DISP, 18 G 18G X 1-1/2" MISC    Sig: 1 Units by Does not apply route once a week. To draw up medication    Dispense:  50 each    Refill:  1  . NEEDLE, DISP, 23 G 23G X 1"  MISC    Sig: 1 Units by Does not apply route once a week. To inject medication    Dispense:  50 each    Refill:  1    I personally performed the services described in this documentation, which was scribed in my presence. The recorded information has been reviewed and considered, and addended by me as needed.   Aaron Sorenson, M.D.  Primary Care at [facility] [address] [place], [place] [address] [phone] phone [phone] fax  11/28/17 2:03 AM

## 2017-11-28 ENCOUNTER — Encounter: Payer: Self-pay | Admitting: Family Medicine

## 2017-11-28 DIAGNOSIS — Z79899 Other long term (current) drug therapy: Secondary | ICD-10-CM | POA: Insufficient documentation

## 2017-11-28 DIAGNOSIS — Z79890 Hormone replacement therapy (postmenopausal): Secondary | ICD-10-CM | POA: Insufficient documentation

## 2017-11-28 DIAGNOSIS — Z789 Other specified health status: Secondary | ICD-10-CM | POA: Insufficient documentation

## 2017-11-28 DIAGNOSIS — F640 Transsexualism: Secondary | ICD-10-CM | POA: Insufficient documentation

## 2017-11-28 DIAGNOSIS — J3089 Other allergic rhinitis: Secondary | ICD-10-CM | POA: Insufficient documentation

## 2017-12-31 ENCOUNTER — Encounter: Payer: Self-pay | Admitting: Family Medicine

## 2017-12-31 DIAGNOSIS — F332 Major depressive disorder, recurrent severe without psychotic features: Secondary | ICD-10-CM

## 2017-12-31 DIAGNOSIS — Z789 Other specified health status: Secondary | ICD-10-CM

## 2017-12-31 DIAGNOSIS — F640 Transsexualism: Secondary | ICD-10-CM

## 2018-01-03 ENCOUNTER — Encounter: Payer: Self-pay | Admitting: Family Medicine

## 2018-01-03 MED ORDER — TESTOSTERONE CYPIONATE 200 MG/ML IM SOLN: 60.0000 mg | INTRAMUSCULAR | 0 refills | Status: DC

## 2018-01-07 ENCOUNTER — Telehealth: Payer: Self-pay | Admitting: Family Medicine

## 2018-01-20 ENCOUNTER — Ambulatory Visit: Payer: 59 | Admitting: Family Medicine

## 2018-01-21 ENCOUNTER — Ambulatory Visit: Payer: 59 | Admitting: Family Medicine

## 2018-02-24 NOTE — Telephone Encounter (Signed)
DONE

## 2018-03-29 ENCOUNTER — Ambulatory Visit: Payer: 59 | Admitting: Family Medicine

## 2018-04-19 ENCOUNTER — Encounter: Payer: Self-pay | Admitting: Family Medicine

## 2018-04-28 ENCOUNTER — Encounter: Payer: Self-pay | Admitting: Family Medicine

## 2018-04-28 ENCOUNTER — Ambulatory Visit (INDEPENDENT_AMBULATORY_CARE_PROVIDER_SITE_OTHER): Payer: 59 | Admitting: Family Medicine

## 2018-04-28 ENCOUNTER — Other Ambulatory Visit: Payer: Self-pay

## 2018-04-28 VITALS — BP 124/77 | HR 98 | Temp 98.0°F | Resp 16 | Ht 66.0 in | Wt 116.0 lb

## 2018-04-28 DIAGNOSIS — Z79899 Other long term (current) drug therapy: Secondary | ICD-10-CM | POA: Diagnosis not present

## 2018-04-28 DIAGNOSIS — F332 Major depressive disorder, recurrent severe without psychotic features: Secondary | ICD-10-CM

## 2018-04-28 DIAGNOSIS — F4312 Post-traumatic stress disorder, chronic: Secondary | ICD-10-CM

## 2018-04-28 DIAGNOSIS — F640 Transsexualism: Secondary | ICD-10-CM

## 2018-04-28 DIAGNOSIS — Z789 Other specified health status: Secondary | ICD-10-CM

## 2018-04-28 DIAGNOSIS — Z79890 Hormone replacement therapy (postmenopausal): Secondary | ICD-10-CM

## 2018-04-28 DIAGNOSIS — R636 Underweight: Secondary | ICD-10-CM

## 2018-04-28 LAB — POCT CBC
Granulocyte percent: 64.5 %G (ref 37–80)
HCT, POC: 43.2 % — AB (ref 29–41)
HEMOGLOBIN: 15 g/dL — AB (ref 11–14.6)
Lymph, poc: 2 (ref 0.6–3.4)
MCH: 30.3 pg (ref 27–31.2)
MCHC: 34.8 g/dL (ref 31.8–35.4)
MCV: 87.2 fL (ref 76–111)
MID (cbc): 0.5 (ref 0–0.9)
MPV: 6.8 fL (ref 0–99.8)
POC Granulocyte: 4.5 (ref 2–6.9)
POC LYMPH PERCENT: 28.7 %L (ref 10–50)
POC MID %: 6.8 %M (ref 0–12)
Platelet Count, POC: 248 10*3/uL (ref 142–424)
RBC: 4.95 M/uL (ref 4.69–6.13)
RDW, POC: 11.7 %
WBC: 7 10*3/uL (ref 4.6–10.2)

## 2018-04-28 MED ORDER — SYRINGE (DISPOSABLE) 1 ML MISC: 1.0000 [IU] | 1 refills | Status: AC

## 2018-04-28 MED ORDER — TESTOSTERONE CYPIONATE 200 MG/ML IM SOLN: 80.0000 mg | INTRAMUSCULAR | 1 refills | Status: DC

## 2018-04-28 MED ORDER — "NEEDLE (DISP) 18G X 1-1/2"" MISC": 1.0000 [IU] | 1 refills | Status: AC

## 2018-04-28 MED ORDER — "NEEDLE (DISP) 23G X 1/2"" MISC": 1.0000 [IU] | 0 refills | Status: AC | PRN

## 2018-04-28 NOTE — Patient Instructions (Addendum)
I would like you to come by the office for a "lab-only" visit in about 2 months.  You may walk-in to the [address] appointment clinic any time (M-F 8-5, Sat 8-12) to have your blood drawn without an appointment as I have already ordered the "future" labs in your chart. Make sure to let the front desk know that you are there for a lab-only visit and that you do not need to see a provider.  You do not need to be fasting.     If you have lab work done today you will be contacted with your lab results within the next 2 weeks.  If you have not heard from us then please contact us. The fastest way to get your results is to register for My Chart.   IF you received an x-ray today, you will receive an invoice from [facility]. Please contact [facility] at [phone] with questions or concerns regarding your invoice.   IF you received labwork today, you will receive an invoice from [facility]. Please contact [facility] at [phone] with questions or concerns regarding your invoice.   Our billing staff will not be able to assist you with questions regarding bills from these companies.  You will be contacted with the lab results as soon as they are available. The fastest way to get your results is to activate your My Chart account. Instructions are located on the last page of this paperwork. If you have not heard from us regarding the results in 2 weeks, please contact this office.

## 2018-04-28 NOTE — Progress Notes (Signed)
Subjective:    Patient: Aaron Rangel  DOB: 09-19-1990; 27 y.o.   MRN: [id number]  Chief Complaint  Patient presents with  . Medication Management    HPI  Aaron Rangel is a 27 yo male here today for refills on his testosterone.  Pt has been on hormone therapy with testosterone injections since 12/23/2011. Been on the same dose of Testosterone 0.5ml = 60mg  [place] once a week for years but at the time of our last (and first/only prior) visit together when he established care 6 mos ago, he had been off of testosterone x  9-10 months as had run out and didn't know where to go to obtain and couldn't afford visit and meds. Therefore, he was restarted on testosterone 0.21mL (=60mg ) [place] qwk which he has been doing for the past 6 mos until his 6 mos supply ran out 1.5 wks ago - so last injection ~10-11d prior. and has been out for 1.5 wks and prior to that had to skip a few doses or stretch them out to make them last.  Feels like dose isn't high enough - has a lot of fatigue, no appetite and would like to try higher dose of T to see if these things improve - he thinks they will as he notices they worsen even further when he has to space out his dosing.  Pt had top surgery done in [place], [place] and hysterectomy with oophorectomy in 2015 at [facility].  Pt was prev followed by psychiatrist Dr. Maggie Rangel (Aaron Rangel) but things weren't working very well and felt like not being listened to and sxs uncontrolled so has Changed to seeing psych at [facility] - Aaron Rangel psych NP and well as therapist.  Fatigue, appettite, depression and anxiety are improving a lot. CPTSD dx which really hit home w/ him - he is excited/reliefed to have a dx that it feels like fits him and then makes the path to healing with what meds, therapy, etc more clear. Has started EMDR and has found it amazingly helpful.  Buspar was increased from 5 to 15mg  bid. Started on lamotrigine for mood stabilizer which seems to be helping a lot.   No longer on sertraline 100 and mirtazapine 15 - instead switched to effexor 225mg .  Tremors being effectively controlled by propranolol 20  GOing to move to [place] area in sev wks  Medical History Past Medical History:  Diagnosis Date  . ADD (attention deficit disorder)   . Allergy    seasonal/environmental  . Anxiety   . Depression   . Hydrocephalus (HCC)   . Premature birth [birth year]  . VP (ventriculoperitoneal) shunt status 1991   Past Surgical History:  Procedure Laterality Date  . BRAIN HEMATOMA EVACUATION  June 01, 1988  . CARDIAC SURGERY  07/23/1988  . CRS  09/2012  . MASTECTOMY Bilateral 2013   in [place], [place]  . OVARIAN CYST REMOVAL  10/2011   cyst drainage  . TOTAL LAPAROSCOPIC HYSTERECTOMY WITH SALPINGECTOMY Bilateral 11/09/2013   w/ BSO and cervix removed at [facility]  . VENTRICULOPERITONEAL SHUNT  1991   to treat hydrocephalus   Current Outpatient Medications on File Prior to Visit  Medication Sig Dispense Refill  . busPIRone (BUSPAR) 15 MG tablet Take 15 mg by mouth 2 (two) times daily.    . cetirizine-pseudoephedrine (ZYRTEC-D) 5-120 MG tablet Take 1 tablet by mouth every morning.    . lamoTRIgine (LAMICTAL) 150 MG tablet Take 150 mg by mouth daily.    Marland Kitchen  NEEDLE, DISP, 18 G 18G X 1-1/2" MISC 1 Units by Does not apply route once a week. To draw up medication 50 each 1  . NEEDLE, DISP, 23 G 23G X 1" MISC 1 Units by Does not apply route once a week. To inject medication 50 each 1  . propranolol (INDERAL) 20 MG tablet Take 20 mg by mouth 2 (two) times daily.    . Syringe, Disposable, 1 ML MISC 1 Units by Does not apply route once a week. 60 each 1  . testosterone cypionate (DEPO-TESTOSTERONE) 200 MG/ML injection Inject 0.3 mLs (60 mg total) into the muscle once a week. 4 mL 0  . venlafaxine XR (EFFEXOR-XR) 150 MG 24 hr capsule Take 150 mg by mouth daily with breakfast.     No current facility-administered medications on file prior to visit.    Allergies    Allergen Reactions  . Hydrocodone   . Morphine And Related Itching  . Oxycodone Itching and Other (See Comments)    hot  . Sulfa Antibiotics Itching and Rash   Family History  Problem Relation Age of Onset  . Cancer Mother   . Mental illness Mother   . Hyperlipidemia Father   . Mental illness Sister   . Mental illness Brother   . Cancer Maternal Grandmother   . Cancer Maternal Grandfather   . Mental illness Sister   . Mental illness Brother    Social History   Socioeconomic History  . Marital status: Single    Spouse name: Not on file  . Number of children: Not on file  . Years of education: Not on file  . Highest education level: Not on file  Occupational History  . Not on file  Social Needs  . Financial resource strain: Not on file  . Food insecurity:    Worry: Not on file    Inability: Not on file  . Transportation needs:    Medical: Not on file    Non-medical: Not on file  Tobacco Use  . Smoking status: Former Smoker    Types: E-cigarettes    Last attempt to quit: 09/30/2017    Years since quitting: 0.5  . Smokeless tobacco: Never Used  Substance and Sexual Activity  . Alcohol use: No    Comment: weekly  . Drug use: No  . Sexual activity: Yes  Lifestyle  . Physical activity:    Days per week: Not on file    Minutes per session: Not on file  . Stress: Not on file  Relationships  . Social connections:    Talks on phone: Not on file    Gets together: Not on file    Attends religious service: Not on file    Active member of club or organization: Not on file    Attends meetings of clubs or organizations: Not on file    Relationship status: Divorced  Other Topics Concern  . Not on file  Social History Narrative  . Not on file   Depression screen [facility] 2/9 04/28/2018 10/08/2017  Decreased Interest 1 3  Down, Depressed, Hopeless 1 3  PHQ - 2 Score 2 6  Altered sleeping 3 3  Tired, decreased energy 0 3  Change in appetite 1 3  Feeling bad or failure  about yourself  1 3  Trouble concentrating 0 3  Moving slowly or fidgety/restless 0 3  Suicidal thoughts 0 0  PHQ-9 Score 7 24    Review of Systems  Constitutional: Positive for malaise/fatigue  and weight loss.  Endo/Heme/Allergies: Positive for environmental allergies. Negative for polydipsia.  Psychiatric/Behavioral: Positive for depression. The patient is nervous/anxious.    As noted in HPI  Objective:  BP 124/77   Pulse 98   Temp 98 F (36.7 C) (Oral)   Resp 16   Ht 5\' 6"  (1.676 m)   Wt 116 lb (52.6 kg)   SpO2 100%   BMI 18.72 kg/m  Physical Exam Constitutional:      General: He is not in acute distress.    Appearance: He is well-developed. He is not diaphoretic.  HENT:     Head: Normocephalic and atraumatic.     Right Ear: External ear normal.     Left Ear: External ear normal.  Eyes:     General: No scleral icterus.    Conjunctiva/sclera: Conjunctivae normal.  Neck:     Musculoskeletal: Normal range of motion and neck supple.     Thyroid: No thyromegaly.  Cardiovascular:     Rate and Rhythm: Normal rate and regular rhythm.     Heart sounds: Normal heart sounds.  Pulmonary:     Effort: Pulmonary effort is normal. No respiratory distress.     Breath sounds: Normal breath sounds.  Lymphadenopathy:     Cervical: No cervical adenopathy.  Skin:    General: Skin is warm and dry.     Findings: No erythema.  Neurological:     Mental Status: He is alert and oriented to person, place, and time.  Psychiatric:        Behavior: Behavior normal.     POC TESTING Office Visit on 04/28/2018  Component Date Value Ref Range Status  . WBC 04/28/2018 7.0  4.6 - 10.2 K/uL Final  . Lymph, poc 04/28/2018 2.0  0.6 - 3.4 Final  . POC LYMPH PERCENT 04/28/2018 28.7  10 - 50 %L Final  . MID (cbc) 04/28/2018 0.5  0 - 0.9 Final  . POC MID % 04/28/2018 6.8  0 - 12 %M Final  . POC Granulocyte 04/28/2018 4.5  2 - 6.9 Final  . Granulocyte percent 04/28/2018 64.5  37 - 80 %G Final    . RBC 04/28/2018 4.95  4.69 - 6.13 M/uL Final  . Hemoglobin 04/28/2018 15.0* 11 - 14.6 g/dL Final  . HCT, POC 16/02/9603 43.2* 29 - 41 % Final  . MCV 04/28/2018 87.2  76 - 111 fL Final  . MCH, POC 04/28/2018 30.3  27 - 31.2 pg Final  . MCHC 04/28/2018 34.8  31.8 - 35.4 g/dL Final  . RDW, POC 54/01/8118 11.7  % Final  . Platelet Count, POC 04/28/2018 248  142 - 424 K/uL Final  . MPV 04/28/2018 6.8  0 - 99.8 fL Final   Normal CBC reference ranges for adult male: WBC 3.4 - 10.8 x10E3/uL  RBC 4.14 - 5.80 x10E6/uL  Hemoglobin 13.0 - 17.7 g/dL  Hematocrit 14.7 - 82.9 %  MCV 79 - 97 fL  MCH 26.6 - 33.0 pg  MCHC 31.5 - 35.7 g/dL  RDW 56.2 - 13.0 %  Platelets 150 - 379 x10E3/uL     Assessment & Plan:   1. Long-term current use of testosterone cypionate - pt certainly has many of the sxs of low T and he has been on the same dose for many years.  Is s/p total hyst w/ BSO so do not need to worry about estradiol level (unless T gets to high) and should not require as high doses but symptoamtically and clinically  it does seem like he could benefit from higher dose and as is young and healthy w/ only medical complications being severe major depression of the fatigue/anhedonia/sedentary type and now chronic PTSD for which he is under excellent trx w/ psych NP and therapist at [facility], I think it is reasonable to do try to increase his dose to 0.414mL (=80mg ) q wk - no point in checking T levels today since he has been having to skip doses over last mo and none in 10d so will not be able to interpret result helpfully. Therefore, importance of RTC in 2 mos for lab only visit that is 3-4d after injection (usuallhy does inj on Thurs so agrees to have labs drawn on Mon at end of Feb - 4d after injection and 2 mos on higher dose. As long as not at the highest end of the normal range at that time - ok to cont same dose and recheck in 6 mos from the lab draw (8 mos from now). TSH nml 6 mos  ago HGB today in normal range for male which is appropriate for pt so safe to try to increase dose.  2. Encounter for long-term current use of high risk medication   3. Male-to-male transgender person   4. MDD (major depressive disorder), recurrent severe, without psychosis (HCC) - cont following w/ psych NP Aaron [facility]le Sadler [facility] [place] and therapist there - doing better on effexor, lamictal, buspar, propranolol.  5.      Underweight  - improving 6.      Chronic posttraumatic stress disorder (PTSD)   Patient will continue on current chronic medications other than changes noted above, so ok to refill when needed.   See after visit summary for patient specific instructions.  Orders Placed This Encounter  Procedures  . POCT CBC    Meds ordered this encounter  Medications  . NEEDLE, DISP, 18 G 18G X 1-1/2" MISC    Sig: 1 Units by Does not apply route once a week. To draw up medication    Dispense:  50 each    Refill:  1  . Syringe, Disposable, 1 ML MISC    Sig: 1 Units by Does not apply route once a week.    Dispense:  100 each    Refill:  1  . testosterone cypionate (DEPO-TESTOSTERONE) 200 MG/ML injection    Sig: Inject 0.4 mLs (80 mg total) into the muscle once a week.    Dispense:  5 mL    Refill:  1    Need 2 vials/mo so needs 6 vials/12 doses = 3 mo supply. Discard vial after 2 uses.  Marland Kitchen. NEEDLE, DISP, 23 G 23G X 1/2" MISC    Sig: 1 Units by Does not apply route as needed.    Dispense:  100 each    Refill:  0    Patient verbalized to me that they understand the following: diagnosis, what is being done for them, what to expect and what should be done at home.  Their questions have been answered. They understand that I am unable to predict every possible medication interaction or adverse outcome and that if any unexpected symptoms arise, they should contact us and their pharmacist, as well as never hesitate to seek urgent/emergent care at [facility] or ER if they think it might be warranted.    Norberto SorensonEva Sharena Dibenedetto, MD, MPH Primary Care at [facility]  [facility] [address] [place], [place]  [id number] [phone] Office phone  [phone] Office fax  04/28/18 3:57 PM

## 2018-04-30 ENCOUNTER — Encounter: Payer: Self-pay | Admitting: Family Medicine

## 2018-04-30 DIAGNOSIS — R636 Underweight: Secondary | ICD-10-CM | POA: Insufficient documentation

## 2018-04-30 DIAGNOSIS — F4312 Post-traumatic stress disorder, chronic: Secondary | ICD-10-CM | POA: Insufficient documentation

## 2018-05-04 ENCOUNTER — Encounter: Payer: Self-pay | Admitting: Family Medicine

## 2018-05-29 ENCOUNTER — Encounter (HOSPITAL_COMMUNITY): Payer: Self-pay | Admitting: Emergency Medicine

## 2018-05-29 ENCOUNTER — Other Ambulatory Visit: Payer: Self-pay

## 2018-05-29 ENCOUNTER — Emergency Department (HOSPITAL_COMMUNITY)
Admission: EM | Admit: 2018-05-29 | Discharge: 2018-05-30 | Disposition: A | Discharge status: Home / Self Care | Payer: 59 | Attending: Emergency Medicine | Admitting: Emergency Medicine

## 2018-05-29 DIAGNOSIS — F419 Anxiety disorder, unspecified: Secondary | ICD-10-CM | POA: Diagnosis not present

## 2018-05-29 DIAGNOSIS — F10920 Alcohol use, unspecified with intoxication, uncomplicated: Secondary | ICD-10-CM

## 2018-05-29 DIAGNOSIS — Y9389 Activity, other specified: Secondary | ICD-10-CM | POA: Diagnosis not present

## 2018-05-29 DIAGNOSIS — F909 Attention-deficit hyperactivity disorder, unspecified type: Secondary | ICD-10-CM | POA: Insufficient documentation

## 2018-05-29 DIAGNOSIS — Y998 Other external cause status: Secondary | ICD-10-CM | POA: Insufficient documentation

## 2018-05-29 DIAGNOSIS — Z79899 Other long term (current) drug therapy: Secondary | ICD-10-CM | POA: Diagnosis not present

## 2018-05-29 DIAGNOSIS — S0083XA Contusion of other part of head, initial encounter: Secondary | ICD-10-CM

## 2018-05-29 DIAGNOSIS — Y929 Unspecified place or not applicable: Secondary | ICD-10-CM | POA: Insufficient documentation

## 2018-05-29 DIAGNOSIS — Z87891 Personal history of nicotine dependence: Secondary | ICD-10-CM | POA: Diagnosis not present

## 2018-05-29 DIAGNOSIS — F329 Major depressive disorder, single episode, unspecified: Secondary | ICD-10-CM | POA: Diagnosis not present

## 2018-05-29 DIAGNOSIS — S0990XA Unspecified injury of head, initial encounter: Secondary | ICD-10-CM | POA: Diagnosis not present

## 2018-05-29 DIAGNOSIS — W01198A Fall on same level from slipping, tripping and stumbling with subsequent striking against other object, initial encounter: Secondary | ICD-10-CM | POA: Diagnosis not present

## 2018-05-29 NOTE — ED Triage Notes (Addendum)
PT to triage via [facility]>  Pt tripped over a bar stool at a bar.  + ETOH.  C/o hematoma and abrasions to forehead and bruising to nose.  Pt refused c-collar per EMS. Unknown LOC.

## 2018-05-30 ENCOUNTER — Emergency Department (HOSPITAL_COMMUNITY): Payer: 59

## 2018-05-30 NOTE — Discharge Instructions (Signed)
1. Medications: Ibuprofen or Tylenol for pain °2. Treatment: Rest, ice on head.  Concussion precautions given - keep patient in a quiet, not simulating, dark environment. No TV, computer use, video games until headache is resolved completely. No contact sports until cleared by the pediatrician. °3. Follow Up: With primary care physician on Monday if headache persists.  Return to the emergency department if patient becomes lethargic, begins vomiting, develops double vision, speech difficulty, problems walking or other change in mental status. ° °

## 2018-05-30 NOTE — ED Provider Notes (Signed)
[facility] Provider Note   CSN: [id number] Arrival date & time: 05/29/18  2319     History   Chief Complaint Chief Complaint  Patient presents with  . Fall  . Alcohol Intoxication  . Head Injury    HPI Aaron Rangel is a 28 y.o. adult with a hx of ADD, anxiety depression, hydrocephalus s/p VP shunt, male to male transgender with long term testosterone usage, PTSD presents to the Emergency Department complaining of acute, persistent headache onset just PTA after fall.  Pt was out at a bar drinking beer when he stumbled and fell, striking his face and head on the ground.  Bystanders reported to EMS no loss of consciousness however patient does not remember the episode.  Patient alert and oriented x4 throughout the time with EMS.  He denies neck pain.  He refused all intervention by EMS including vital signs, c-collar, additional assessment.  He is much more cooperative with me.  Patient denies associated symptoms.  No aggravating or alleviating factors.  He denies numbness, tingling, weakness.  The history is provided by the patient and medical records. No language interpreter was used.    Past Medical History:  Diagnosis Date  . ADD (attention deficit disorder)   . Allergy    seasonal/environmental  . Anxiety   . Depression   . Hydrocephalus (HCC)   . Premature birth [birth year]  . VP (ventriculoperitoneal) shunt status 1991    Patient Active Problem List   Diagnosis Date Noted  . Underweight 04/30/2018  . Chronic post-traumatic stress disorder (PTSD) 04/30/2018  . Environmental and seasonal allergies 11/28/2017  . Male-to-male transgender person 11/28/2017  . Long-term current use of testosterone cypionate 11/28/2017  . Encounter for long-term current use of high risk medication 11/28/2017  . MDD (major depressive disorder), recurrent severe, without psychosis (HCC) 09/30/2012    Past Surgical History:  Procedure Laterality  Date  . BRAIN HEMATOMA EVACUATION  03/1989  . CARDIAC SURGERY  03/1989  . CRS  09/2012  . [place] 2013   [place]  . OVARIAN CYST REMOVAL  10/2011   cyst drainage  . TOTAL LAPAROSCOPIC HYSTERECTOMY WITH SALPINGECTOMY Bilateral 11/09/2013   w/ BSO [facility]  . VENTRICULOPERITONEAL SHUNT  1991   to treat hydrocephalus     OB History   No obstetric history on file.      Home Medications    Prior to Admission medications   Medication Sig Start Date End Date Taking? Authorizing Provider  busPIRone (BUSPAR) 15 MG tablet Take 15 mg by mouth 2 (two) times daily.    Rosemarie BeathSadler, Michelle, NP  cetirizine-pseudoephedrine (ZYRTEC-D) 5-120 MG tablet Take 1 tablet by mouth every morning. 10/08/17   Sherren MochaShaw, Eva N, MD  lamoTRIgine (LAMICTAL) 150 MG tablet Take 150 mg by mouth daily.    Rosemarie BeathSadler, Michelle, NP  NEEDLE, DISP, 18 G 18G X 1-1/2" MISC 1 Units by Does not apply route once a week. To draw up medication 04/28/18   Sherren MochaShaw, Eva N, MD  NEEDLE, DISP, 23 G 23G X 1" MISC 1 Units by Does not apply route once a week. To inject medication 10/08/17   Sherren MochaShaw, Eva N, MD  NEEDLE, DISP, 23 G 23G X 1/2" MISC 1 Units by Does not apply route as needed. 04/28/18   Sherren MochaShaw, Eva N, MD  propranolol (INDERAL) 20 MG tablet Take 20 mg by mouth 2 (two) times daily. For tremors  Rosemarie Beath, NP  Syringe, Disposable, 1 ML MISC 1 Units by Does not apply route once a week. 04/28/18   Sherren Mocha, MD  testosterone cypionate (DEPO-TESTOSTERONE) 200 MG/ML injection Inject 0.4 mLs (80 mg total) into the muscle once a week. 04/28/18   Sherren Mocha, MD  venlafaxine XR (EFFEXOR-XR) 150 MG 24 hr capsule Take 225 mg by mouth daily with breakfast.    Rosemarie Beath, NP    Family History Family History  Problem Relation Age of Onset  . Cancer Mother   . Mental illness Mother   . Hyperlipidemia Father   . Mental illness Sister   . Mental illness Brother   . Cancer Maternal  Grandmother   . Cancer Maternal Grandfather   . Mental illness Sister   . Mental illness Brother     Social History Social History   Tobacco Use  . Smoking status: Former Smoker    Types: E-cigarettes    Last attempt to quit: 09/30/2017    Years since quitting: 0.6  . Smokeless tobacco: Never Used  Substance Use Topics  . Alcohol use: No    Comment: weekly  . Drug use: No     Allergies   Hydrocodone; Morphine and related; Oxycodone; and Sulfa antibiotics   Review of Systems Review of Systems  Constitutional: Negative for appetite change, diaphoresis, fatigue, fever and unexpected weight change.  HENT: Positive for facial swelling. Negative for mouth sores.   Eyes: Negative for visual disturbance.  Respiratory: Negative for cough, chest tightness, shortness of breath and wheezing.   Cardiovascular: Negative for chest pain.  Gastrointestinal: Negative for abdominal pain, constipation, diarrhea, nausea and vomiting.  Endocrine: Negative for polydipsia, polyphagia and polyuria.  Genitourinary: Negative for dysuria, frequency, hematuria and urgency.  Musculoskeletal: Negative for back pain and neck stiffness.  Skin: Positive for wound. Negative for rash.  Allergic/Immunologic: Negative for immunocompromised state.  Neurological: Positive for headaches. Negative for syncope and light-headedness.  Hematological: Does not bruise/bleed easily.  Psychiatric/Behavioral: Negative for sleep disturbance. The patient is not nervous/anxious.      Physical Exam Updated Vital Signs BP (!) 129/93   Pulse 81   Temp 97.9 F (36.6 C) (Oral)   Resp 16   SpO2 100%   Physical Exam Vitals signs and nursing note reviewed.  Constitutional:      General: He is not in acute distress.    Appearance: He is well-developed. He is not diaphoretic.  HENT:     Head: Normocephalic.     Jaw: There is normal jaw occlusion. No trismus, tenderness, swelling, pain on movement or malocclusion.       Comments: Previous surgical scar from VP shunt insertion    Nose: Nasal deformity, signs of injury and nasal tenderness present.   Eyes:     General: No scleral icterus.    Conjunctiva/sclera: Conjunctivae normal.     Pupils: Pupils are equal, round, and reactive to light.     Comments: No horizontal, vertical or rotational nystagmus  Neck:     Musculoskeletal: Normal range of motion and neck supple.     Comments: No midline or paraspinal tenderness Patient placed in c-collar due to intoxication and head injury. VP shunt palpable. Cardiovascular:     Rate and Rhythm: Normal rate and regular rhythm.  Pulmonary:     Effort: Pulmonary effort is normal. No respiratory distress.     Breath sounds: Normal breath sounds. No wheezing or rales.  Abdominal:     General:  Bowel sounds are normal.     Palpations: Abdomen is soft.     Tenderness: There is no abdominal tenderness. There is no guarding or rebound.  Musculoskeletal: Normal range of motion.  Lymphadenopathy:     Cervical: No cervical adenopathy.  Skin:    General: Skin is warm and dry.     Findings: No rash.  Neurological:     Mental Status: He is alert and oriented to person, place, and time.     Cranial Nerves: No cranial nerve deficit.     Motor: No abnormal muscle tone.     Coordination: Coordination normal.     Comments: Mental Status:  Alert, oriented, thought content appropriate. Speech fluent without evidence of aphasia. Able to follow 2 step commands without difficulty.  Cranial Nerves:  II:  Peripheral visual fields grossly normal, pupils equal, round, reactive to light III,IV, VI: ptosis not present, extra-ocular motions intact bilaterally  V,VII: smile symmetric, facial light touch sensation equal VIII: hearing grossly normal bilaterally  IX,X: midline uvula rise  XI: bilateral shoulder shrug equal and strong XII: midline tongue extension  Motor:  5/5 in upper and lower extremities bilaterally including strong  and equal grip strength and dorsiflexion/plantar flexion Sensory: Pinprick and light touch normal in all extremities.  Cerebellar: normal finger-to-nose with bilateral upper extremities Gait: normal gait and balance CV: distal pulses palpable throughout   Psychiatric:        Behavior: Behavior normal.        Thought Content: Thought content normal.        Judgment: Judgment normal.      ED Treatments / Results   Radiology Dg Skull 1-3 Views  Result Date: 05/30/2018 CLINICAL DATA:  Ventriculoperitoneal shunt. EXAM: SKULL - 1-3 VIEW COMPARISON:  None. FINDINGS: Shunt catheter tubing courses along the right head and neck. No focal discontinuity. IMPRESSION: Intact shunt catheter tubing. Electronically Signed   By: Deatra Robinson M.D.   On: 05/30/2018 01:48   Dg Chest 1 View  Result Date: 05/30/2018 CLINICAL DATA:  Fall EXAM: CHEST  1 VIEW; ABDOMEN - 1 VIEW COMPARISON:  None. FINDINGS: The heart size and mediastinal contours are within normal limits. Both lungs are clear. The visualized skeletal structures are unremarkable. Normal bowel gas pattern. Shunt catheter tubing coursing along the right chest and abdomen is intact. IMPRESSION: Normal chest and abdominal radiographs. Radiographically intact shunt catheter tubing terminating in the pelvis. Electronically Signed   By: Deatra Robinson M.D.   On: 05/30/2018 01:49   Dg Abd 1 View  Result Date: 05/30/2018 CLINICAL DATA:  Fall EXAM: CHEST  1 VIEW; ABDOMEN - 1 VIEW COMPARISON:  None. FINDINGS: The heart size and mediastinal contours are within normal limits. Both lungs are clear. The visualized skeletal structures are unremarkable. Normal bowel gas pattern. Shunt catheter tubing coursing along the right chest and abdomen is intact. IMPRESSION: Normal chest and abdominal radiographs. Radiographically intact shunt catheter tubing terminating in the pelvis. Electronically Signed   By: Deatra Robinson M.D.   On: 05/30/2018 01:49   Ct Head Wo  Contrast  Result Date: 05/30/2018 CLINICAL DATA:  Fall with head trauma. History of shunt. EXAM: CT HEAD WITHOUT CONTRAST CT MAXILLOFACIAL WITHOUT CONTRAST CT CERVICAL SPINE WITHOUT CONTRAST TECHNIQUE: Multidetector CT imaging of the head, cervical spine, and maxillofacial structures were performed using the standard protocol without intravenous contrast. Multiplanar CT image reconstructions of the cervical spine and maxillofacial structures were also generated. COMPARISON:  Head CT 07/12/2006 FINDINGS: CT HEAD  FINDINGS Brain: There is no mass, hemorrhage or extra-axial collection. There is a right frontal approach shunt catheter with its tip at the frontal horn of the right lateral ventricle. The size and configuration of the ventricles are unchanged compared to 07/12/2006. The brain parenchyma is normal, without evidence of acute or chronic infarction. Vascular: No abnormal hyperdensity of the major intracranial arteries or dural venous sinuses. No intracranial atherosclerosis. Skull: Left frontal scalp hematoma.  No skull fracture. CT MAXILLOFACIAL FINDINGS Osseous: --Complex facial fracture types: No LeFort, zygomaticomaxillary complex or nasoorbitoethmoidal fracture. --Simple fracture types: None. --Mandible: No fracture or dislocation. Orbits: The globes are intact. Normal appearance of the intra- and extraconal fat. Symmetric extraocular muscles and optic nerves. Sinuses: No fluid levels or advanced mucosal thickening. Soft tissues: Normal visualized extracranial soft tissues. CT CERVICAL SPINE FINDINGS Alignment: No static subluxation. Facets are aligned. Occipital condyles and the lateral masses of C1-C2 are aligned. Skull base and vertebrae: No acute fracture. Soft tissues and spinal canal: No prevertebral fluid or swelling. No visible canal hematoma. Disc levels: No advanced spinal canal or neural foraminal stenosis. Upper chest: No pneumothorax, pulmonary nodule or pleural effusion. Other: Normal  visualized paraspinal cervical soft tissues. IMPRESSION: 1. No acute intracranial abnormality. Unchanged size and configuration of the shunted ventricles. 2. Left frontal scalp hematoma without skull fracture. 3. No facial fracture. 4. No acute fracture or static subluxation of the cervical spine. Electronically Signed   By: Deatra Robinson M.D.   On: 05/30/2018 02:13   Ct Cervical Spine Wo Contrast  Result Date: 05/30/2018 CLINICAL DATA:  Fall with head trauma. History of shunt. EXAM: CT HEAD WITHOUT CONTRAST CT MAXILLOFACIAL WITHOUT CONTRAST CT CERVICAL SPINE WITHOUT CONTRAST TECHNIQUE: Multidetector CT imaging of the head, cervical spine, and maxillofacial structures were performed using the standard protocol without intravenous contrast. Multiplanar CT image reconstructions of the cervical spine and maxillofacial structures were also generated. COMPARISON:  Head CT 07/12/2006 FINDINGS: CT HEAD FINDINGS Brain: There is no mass, hemorrhage or extra-axial collection. There is a right frontal approach shunt catheter with its tip at the frontal horn of the right lateral ventricle. The size and configuration of the ventricles are unchanged compared to 07/12/2006. The brain parenchyma is normal, without evidence of acute or chronic infarction. Vascular: No abnormal hyperdensity of the major intracranial arteries or dural venous sinuses. No intracranial atherosclerosis. Skull: Left frontal scalp hematoma.  No skull fracture. CT MAXILLOFACIAL FINDINGS Osseous: --Complex facial fracture types: No LeFort, zygomaticomaxillary complex or nasoorbitoethmoidal fracture. --Simple fracture types: None. --Mandible: No fracture or dislocation. Orbits: The globes are intact. Normal appearance of the intra- and extraconal fat. Symmetric extraocular muscles and optic nerves. Sinuses: No fluid levels or advanced mucosal thickening. Soft tissues: Normal visualized extracranial soft tissues. CT CERVICAL SPINE FINDINGS Alignment: No  static subluxation. Facets are aligned. Occipital condyles and the lateral masses of C1-C2 are aligned. Skull base and vertebrae: No acute fracture. Soft tissues and spinal canal: No prevertebral fluid or swelling. No visible canal hematoma. Disc levels: No advanced spinal canal or neural foraminal stenosis. Upper chest: No pneumothorax, pulmonary nodule or pleural effusion. Other: Normal visualized paraspinal cervical soft tissues. IMPRESSION: 1. No acute intracranial abnormality. Unchanged size and configuration of the shunted ventricles. 2. Left frontal scalp hematoma without skull fracture. 3. No facial fracture. 4. No acute fracture or static subluxation of the cervical spine. Electronically Signed   By: Deatra Robinson M.D.   On: 05/30/2018 02:13   Ct Maxillofacial Wo Contrast  Result  Date: 05/30/2018 CLINICAL DATA:  Fall with head trauma. History of shunt. EXAM: CT HEAD WITHOUT CONTRAST CT MAXILLOFACIAL WITHOUT CONTRAST CT CERVICAL SPINE WITHOUT CONTRAST TECHNIQUE: Multidetector CT imaging of the head, cervical spine, and maxillofacial structures were performed using the standard protocol without intravenous contrast. Multiplanar CT image reconstructions of the cervical spine and maxillofacial structures were also generated. COMPARISON:  Head CT 07/12/2006 FINDINGS: CT HEAD FINDINGS Brain: There is no mass, hemorrhage or extra-axial collection. There is a right frontal approach shunt catheter with its tip at the frontal horn of the right lateral ventricle. The size and configuration of the ventricles are unchanged compared to 07/12/2006. The brain parenchyma is normal, without evidence of acute or chronic infarction. Vascular: No abnormal hyperdensity of the major intracranial arteries or dural venous sinuses. No intracranial atherosclerosis. Skull: Left frontal scalp hematoma.  No skull fracture. CT MAXILLOFACIAL FINDINGS Osseous: --Complex facial fracture types: No LeFort, zygomaticomaxillary complex or  nasoorbitoethmoidal fracture. --Simple fracture types: None. --Mandible: No fracture or dislocation. Orbits: The globes are intact. Normal appearance of the intra- and extraconal fat. Symmetric extraocular muscles and optic nerves. Sinuses: No fluid levels or advanced mucosal thickening. Soft tissues: Normal visualized extracranial soft tissues. CT CERVICAL SPINE FINDINGS Alignment: No static subluxation. Facets are aligned. Occipital condyles and the lateral masses of C1-C2 are aligned. Skull base and vertebrae: No acute fracture. Soft tissues and spinal canal: No prevertebral fluid or swelling. No visible canal hematoma. Disc levels: No advanced spinal canal or neural foraminal stenosis. Upper chest: No pneumothorax, pulmonary nodule or pleural effusion. Other: Normal visualized paraspinal cervical soft tissues. IMPRESSION: 1. No acute intracranial abnormality. Unchanged size and configuration of the shunted ventricles. 2. Left frontal scalp hematoma without skull fracture. 3. No facial fracture. 4. No acute fracture or static subluxation of the cervical spine. Electronically Signed   By: Deatra RobinsonKevin  Herman M.D.   On: 05/30/2018 02:13    Procedures Procedures (including critical care time)  Medications Ordered in ED Medications - No data to display   Initial Impression / Assessment and Plan / ED Course  I have reviewed the triage vital signs and the nursing notes.  Pertinent labs & imaging results that were available during my care of the patient were reviewed by me and considered in my medical decision making (see chart for details).     Patient presents with minor head trauma and contusion to the forehead after fall at the bar after drinking alcohol.  He is alert and oriented on my exam.  No neck pain however he continues to be inebriated.  C-collar placed.  Patient has a history of hydrocephalus with a VP shunt.  Will obtain CT head, neck, maxillofacial and shunt series.  2:34 AM All imaging is  reassuring.  Despite significant contusions to the face there appears to be no fracture of the face or skull, no intracranial hemorrhage and normal alignment of the cervical spine.  C-collar removed and patient with full range of motion without pain.  Patient has been able to to eat, drink and walk without difficulty.  Patient discharged home with concussion precautions.  He states understanding and is in agreement with the plan.  Final Clinical Impressions(s) / ED Diagnoses   Final diagnoses:  Alcoholic intoxication without complication (HCC)  Minor head injury, initial encounter  Contusion of face, initial encounter    ED Discharge Orders    None       Mardene SayerMuthersbaugh, Boyd KerbsHannah, PA-C 05/30/18 0235    Ward, Layla MawKristen N,  DO 05/30/18 0440

## 2018-07-08 ENCOUNTER — Other Ambulatory Visit: Payer: Self-pay | Admitting: Family Medicine

## 2018-07-08 DIAGNOSIS — Z79899 Other long term (current) drug therapy: Secondary | ICD-10-CM

## 2018-07-08 DIAGNOSIS — Z79890 Hormone replacement therapy (postmenopausal): Secondary | ICD-10-CM

## 2018-07-08 NOTE — Telephone Encounter (Signed)
Please advise 

## 2018-07-08 NOTE — Telephone Encounter (Signed)
Testosterone refill request  Pt of Dr. Clelia Croft.   No future appt with another provider.

## 2018-07-12 ENCOUNTER — Encounter: Payer: Self-pay | Admitting: Family Medicine

## 2018-07-13 ENCOUNTER — Ambulatory Visit (INDEPENDENT_AMBULATORY_CARE_PROVIDER_SITE_OTHER): Payer: 59 | Admitting: Family Medicine

## 2018-07-13 ENCOUNTER — Other Ambulatory Visit: Payer: Self-pay

## 2018-07-13 DIAGNOSIS — Z79890 Hormone replacement therapy (postmenopausal): Secondary | ICD-10-CM

## 2018-07-13 DIAGNOSIS — Z79899 Other long term (current) drug therapy: Secondary | ICD-10-CM

## 2018-07-15 LAB — CBC
Hematocrit: 45.6 % (ref 37.5–51.0)
Hemoglobin: 15.4 g/dL (ref 13.0–17.7)
MCH: 30.6 pg (ref 26.6–33.0)
MCHC: 33.8 g/dL (ref 31.5–35.7)
MCV: 91 fL (ref 79–97)
PLATELETS: 218 10*3/uL (ref 150–450)
RBC: 5.04 x10E6/uL (ref 4.14–5.80)
RDW: 11.7 % (ref 11.6–15.4)
WBC: 6.2 10*3/uL (ref 3.4–10.8)

## 2018-07-15 LAB — COMPREHENSIVE METABOLIC PANEL
A/G RATIO: 2.1 (ref 1.2–2.2)
ALT: 16 IU/L (ref 0–44)
AST: 18 IU/L (ref 0–40)
Albumin: 4.8 g/dL (ref 4.1–5.2)
Alkaline Phosphatase: 93 IU/L (ref 39–117)
BUN / CREAT RATIO: 9 (ref 9–20)
BUN: 10 mg/dL (ref 6–20)
Bilirubin Total: 0.6 mg/dL (ref 0.0–1.2)
CO2: 25 mmol/L (ref 20–29)
Calcium: 9.6 mg/dL (ref 8.7–10.2)
Chloride: 101 mmol/L (ref 96–106)
Creatinine, Ser: 1.09 mg/dL (ref 0.76–1.27)
GFR calc Af Amer: 105 mL/min/{1.73_m2} (ref >59)
GFR calc non Af Amer: 91 mL/min/{1.73_m2} (ref >59)
Globulin, Total: 2.3 g/dL (ref 1.5–4.5)
Glucose: 77 mg/dL (ref 65–99)
Potassium: 4.7 mmol/L (ref 3.5–5.2)
Sodium: 142 mmol/L (ref 134–144)
Total Protein: 7.1 g/dL (ref 6.0–8.5)

## 2018-07-15 LAB — TESTT+TESTF+SHBG
Sex Hormone Binding: 41.4 nmol/L (ref 16.5–55.9)
TESTOSTERONE, TOTAL, LC/MS: 429.4 ng/dL (ref 264.0–916.0)
Testosterone, Free: 9.7 pg/mL (ref 9.3–26.5)

## 2018-07-15 LAB — ESTRADIOL: Estradiol: 16.8 pg/mL (ref 7.6–42.6)

## 2018-10-31 ENCOUNTER — Ambulatory Visit: Payer: 59 | Admitting: Family Medicine

## 2019-07-06 IMAGING — DX DG CHEST 1V
1 series · 1 of 1 positions shown · non-contrast
Comparison: None.

CLINICAL DATA: Fall

EXAM:
CHEST  1 VIEW; ABDOMEN - 1 VIEW

[chest ap]
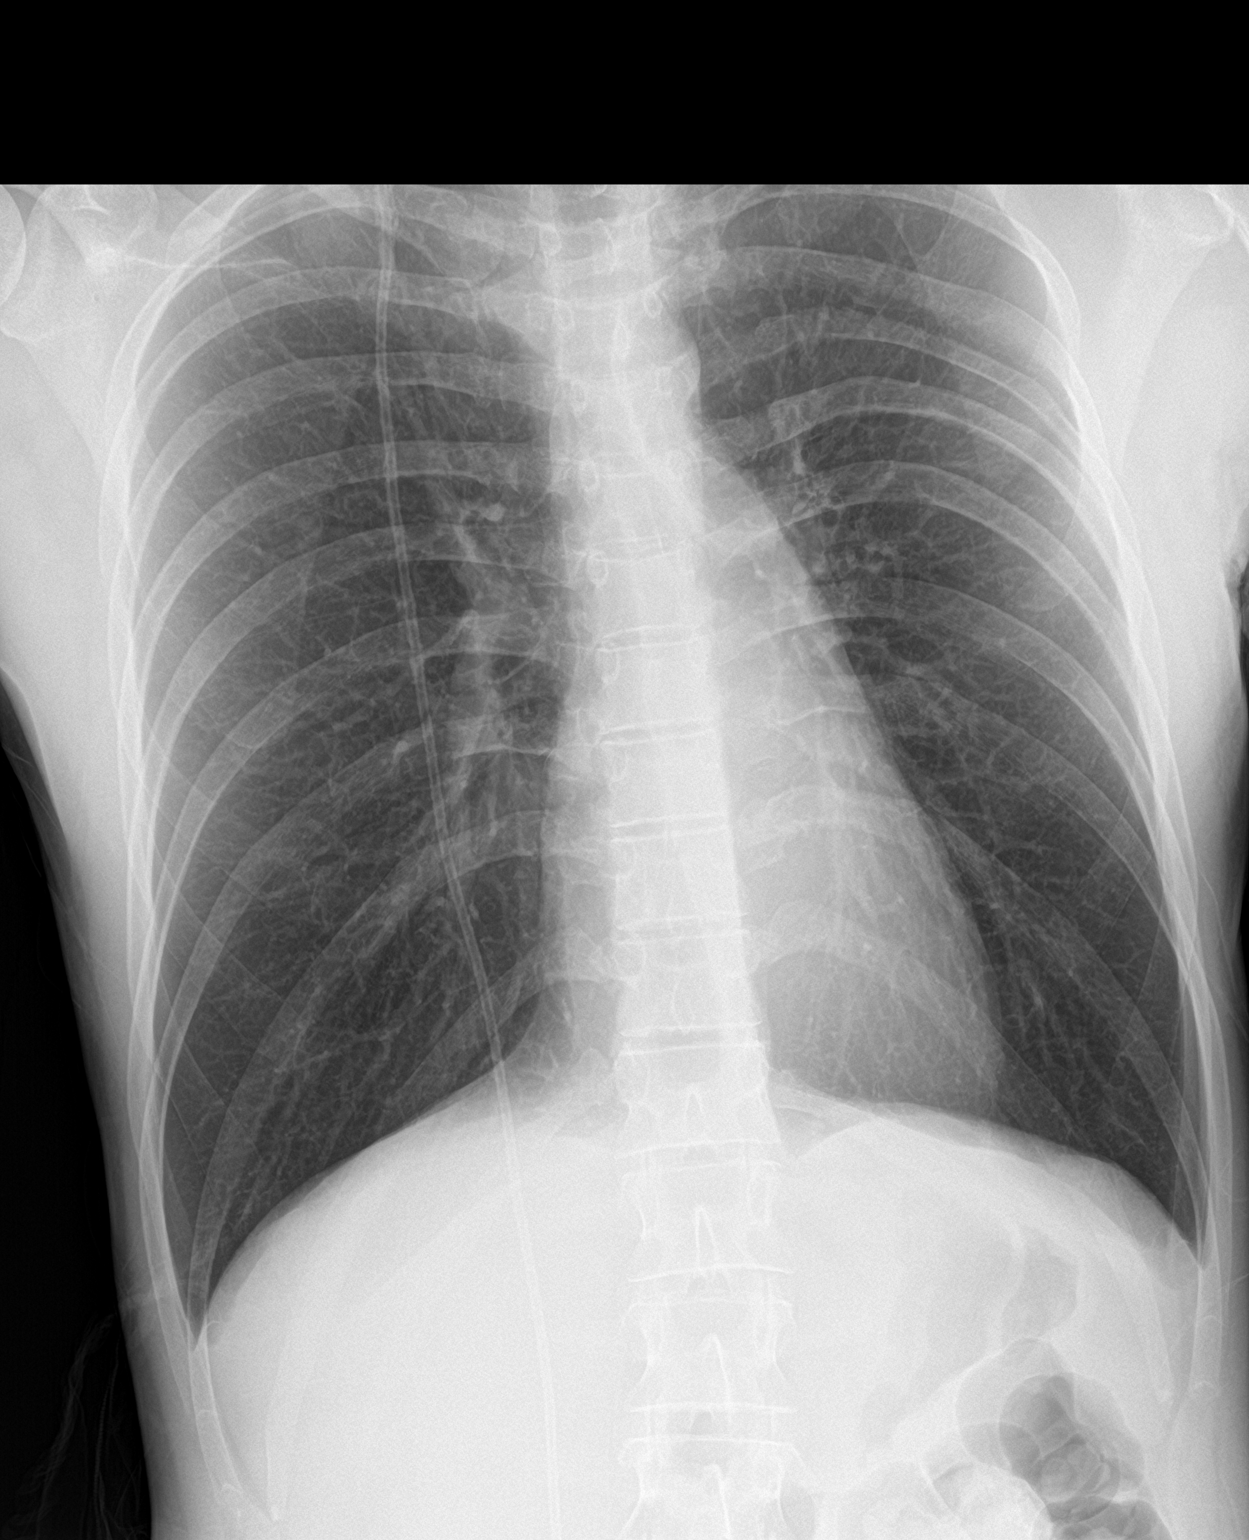

[1 of 1 positions shown; findings below may reference images not displayed]

FINDINGS: The heart size and mediastinal contours are within normal limits.
Both lungs are clear. The visualized skeletal structures are
unremarkable. Normal bowel gas pattern. Shunt catheter tubing
coursing along the right chest and abdomen is intact.
IMPRESSION: Normal chest and abdominal radiographs. Radiographically intact
shunt catheter tubing terminating in the pelvis.

## 2021-08-05 ENCOUNTER — Encounter (HOSPITAL_COMMUNITY): Payer: Self-pay | Admitting: Emergency Medicine
